# Patient Record
Sex: Male | Born: 1980 | Race: White | Hispanic: No | Marital: Married | Smoking: Never smoker
Health system: Southern US, Community
[De-identification: ages and names within clinical notes are randomized; demographics above are authoritative.]

## PROBLEM LIST (undated history)

## (undated) DIAGNOSIS — F419 Anxiety disorder, unspecified: Secondary | ICD-10-CM

## (undated) DIAGNOSIS — F1910 Other psychoactive substance abuse, uncomplicated: Secondary | ICD-10-CM

## (undated) DIAGNOSIS — F32A Depression, unspecified: Secondary | ICD-10-CM

## (undated) DIAGNOSIS — F329 Major depressive disorder, single episode, unspecified: Secondary | ICD-10-CM

## (undated) DIAGNOSIS — F1110 Opioid abuse, uncomplicated: Secondary | ICD-10-CM

## (undated) DIAGNOSIS — R569 Unspecified convulsions: Secondary | ICD-10-CM

## (undated) HISTORY — PX: VARICOCELE EXCISION: SUR582

---

## 2005-11-20 ENCOUNTER — Emergency Department (HOSPITAL_COMMUNITY): Admission: EM | Admit: 2005-11-20 | Discharge: 2005-11-20 | Payer: Self-pay | Admitting: Emergency Medicine

## 2006-04-13 ENCOUNTER — Ambulatory Visit: Payer: Self-pay | Admitting: Orthopedic Surgery

## 2006-04-15 ENCOUNTER — Ambulatory Visit (HOSPITAL_COMMUNITY): Admission: RE | Admit: 2006-04-15 | Discharge: 2006-04-15 | Payer: Self-pay | Admitting: Orthopedic Surgery

## 2006-04-30 ENCOUNTER — Ambulatory Visit: Payer: Self-pay | Admitting: Orthopedic Surgery

## 2007-11-19 ENCOUNTER — Emergency Department (HOSPITAL_BASED_OUTPATIENT_CLINIC_OR_DEPARTMENT_OTHER): Admission: EM | Admit: 2007-11-19 | Discharge: 2007-11-19 | Payer: Self-pay | Admitting: Emergency Medicine

## 2008-04-29 ENCOUNTER — Emergency Department (HOSPITAL_BASED_OUTPATIENT_CLINIC_OR_DEPARTMENT_OTHER): Admission: EM | Admit: 2008-04-29 | Discharge: 2008-04-29 | Payer: Self-pay | Admitting: Emergency Medicine

## 2008-04-29 ENCOUNTER — Ambulatory Visit: Payer: Self-pay | Admitting: Diagnostic Radiology

## 2008-05-15 ENCOUNTER — Ambulatory Visit (HOSPITAL_COMMUNITY): Admission: RE | Admit: 2008-05-15 | Discharge: 2008-05-15 | Payer: Self-pay | Admitting: Neurology

## 2008-10-04 ENCOUNTER — Ambulatory Visit: Payer: Self-pay | Admitting: Orthopedic Surgery

## 2008-10-04 DIAGNOSIS — M238X9 Other internal derangements of unspecified knee: Secondary | ICD-10-CM

## 2008-10-06 ENCOUNTER — Encounter: Payer: Self-pay | Admitting: Orthopedic Surgery

## 2009-01-10 ENCOUNTER — Telehealth: Payer: Self-pay | Admitting: Orthopedic Surgery

## 2009-01-18 ENCOUNTER — Ambulatory Visit: Payer: Self-pay | Admitting: Orthopedic Surgery

## 2009-01-18 ENCOUNTER — Telehealth: Payer: Self-pay | Admitting: Orthopedic Surgery

## 2009-04-03 ENCOUNTER — Ambulatory Visit (HOSPITAL_COMMUNITY): Admission: RE | Admit: 2009-04-03 | Discharge: 2009-04-03 | Payer: Self-pay | Admitting: Family Medicine

## 2010-05-18 ENCOUNTER — Other Ambulatory Visit: Payer: Self-pay | Admitting: Orthopedic Surgery

## 2010-05-18 ENCOUNTER — Encounter: Payer: Self-pay | Admitting: Sports Medicine

## 2010-05-18 DIAGNOSIS — M75101 Unspecified rotator cuff tear or rupture of right shoulder, not specified as traumatic: Secondary | ICD-10-CM

## 2010-06-03 ENCOUNTER — Other Ambulatory Visit: Payer: Self-pay

## 2010-06-11 ENCOUNTER — Other Ambulatory Visit: Payer: Self-pay

## 2010-08-12 LAB — COMPREHENSIVE METABOLIC PANEL
ALT: 11 U/L (ref 0–53)
BUN: 10 mg/dL (ref 6–23)
Calcium: 9.4 mg/dL (ref 8.4–10.5)
Creatinine, Ser: 1 mg/dL (ref 0.4–1.5)
GFR calc Af Amer: >60 mL/min (ref >60)
Glucose, Bld: 100 mg/dL — ABNORMAL HIGH (ref 70–99)

## 2010-08-12 LAB — DIFFERENTIAL
Basophils Absolute: 0.1 10*3/uL (ref 0.0–0.1)
Eosinophils Absolute: 0.1 10*3/uL (ref 0.0–0.7)
Monocytes Relative: 8 % (ref 3–12)
Neutrophils Relative %: 71 % (ref 43–77)

## 2010-08-12 LAB — URINALYSIS, ROUTINE W REFLEX MICROSCOPIC
Bilirubin Urine: NEGATIVE
Ketones, ur: NEGATIVE mg/dL
Leukocytes, UA: NEGATIVE
Nitrite: NEGATIVE
Protein, ur: 100 mg/dL — AB
Specific Gravity, Urine: 1.018 (ref 1.005–1.030)
Urobilinogen, UA: 0.2 mg/dL (ref 0.0–1.0)

## 2010-08-12 LAB — POCT TOXICOLOGY PANEL

## 2010-08-12 LAB — CBC
HCT: 45.7 % (ref 39.0–52.0)
MCHC: 33.7 g/dL (ref 30.0–36.0)
MCV: 93 fL (ref 78.0–100.0)

## 2010-08-12 LAB — URINE MICROSCOPIC-ADD ON

## 2011-01-24 LAB — COMPREHENSIVE METABOLIC PANEL
ALT: 24
Albumin: 4.8
CO2: 28
GFR calc Af Amer: >60
Total Bilirubin: 0.5

## 2011-01-24 LAB — CBC
MCV: 92.6
RBC: 4.47
RDW: 11.3 — ABNORMAL LOW
WBC: 9.6

## 2011-01-24 LAB — DIFFERENTIAL
Basophils Absolute: 0
Basophils Relative: 1
Lymphs Abs: 1.4
Neutrophils Relative %: 80 — ABNORMAL HIGH

## 2011-02-24 ENCOUNTER — Emergency Department (INDEPENDENT_AMBULATORY_CARE_PROVIDER_SITE_OTHER): Payer: BC Managed Care – PPO

## 2011-02-24 ENCOUNTER — Encounter: Payer: Self-pay | Admitting: Student

## 2011-02-24 ENCOUNTER — Emergency Department (HOSPITAL_BASED_OUTPATIENT_CLINIC_OR_DEPARTMENT_OTHER)
Admission: EM | Admit: 2011-02-24 | Discharge: 2011-02-24 | Disposition: A | Discharge status: Home / Self Care | Payer: BC Managed Care – PPO | Attending: Emergency Medicine | Admitting: Emergency Medicine

## 2011-02-24 DIAGNOSIS — R197 Diarrhea, unspecified: Secondary | ICD-10-CM

## 2011-02-24 DIAGNOSIS — R110 Nausea: Secondary | ICD-10-CM

## 2011-02-24 DIAGNOSIS — R109 Unspecified abdominal pain: Secondary | ICD-10-CM | POA: Insufficient documentation

## 2011-02-24 DIAGNOSIS — R51 Headache: Secondary | ICD-10-CM | POA: Insufficient documentation

## 2011-02-24 DIAGNOSIS — F341 Dysthymic disorder: Secondary | ICD-10-CM | POA: Insufficient documentation

## 2011-02-24 DIAGNOSIS — R1031 Right lower quadrant pain: Secondary | ICD-10-CM

## 2011-02-24 DIAGNOSIS — R112 Nausea with vomiting, unspecified: Secondary | ICD-10-CM | POA: Insufficient documentation

## 2011-02-24 DIAGNOSIS — R1110 Vomiting, unspecified: Secondary | ICD-10-CM

## 2011-02-24 HISTORY — DX: Anxiety disorder, unspecified: F41.9

## 2011-02-24 HISTORY — DX: Major depressive disorder, single episode, unspecified: F32.9

## 2011-02-24 HISTORY — DX: Depression, unspecified: F32.A

## 2011-02-24 LAB — BASIC METABOLIC PANEL
BUN: 22 mg/dL (ref 6–23)
CO2: 26 mEq/L (ref 19–32)
Calcium: 9.5 mg/dL (ref 8.4–10.5)
Chloride: 101 mEq/L (ref 96–112)
Creatinine, Ser: 1 mg/dL (ref 0.50–1.35)
GFR calc non Af Amer: >90 mL/min (ref >90)
Glucose, Bld: 163 mg/dL — ABNORMAL HIGH (ref 70–99)
Potassium: 3.8 mEq/L (ref 3.5–5.1)

## 2011-02-24 LAB — URINALYSIS, ROUTINE W REFLEX MICROSCOPIC
Bilirubin Urine: NEGATIVE
Leukocytes, UA: NEGATIVE
Nitrite: NEGATIVE
Protein, ur: NEGATIVE mg/dL
pH: 6.5 (ref 5.0–8.0)

## 2011-02-24 LAB — DIFFERENTIAL
Basophils Absolute: 0 10*3/uL (ref 0.0–0.1)
Basophils Relative: 0 % (ref 0–1)
Eosinophils Absolute: 0 10*3/uL (ref 0.0–0.7)
Lymphs Abs: 0.3 10*3/uL — ABNORMAL LOW (ref 0.7–4.0)
Monocytes Absolute: 0.9 10*3/uL (ref 0.1–1.0)
Neutrophils Relative %: 92 % — ABNORMAL HIGH (ref 43–77)

## 2011-02-24 LAB — CBC
Hemoglobin: 16.5 g/dL (ref 13.0–17.0)
MCHC: 35.3 g/dL (ref 30.0–36.0)
Platelets: 340 10*3/uL (ref 150–400)
RBC: 5.3 MIL/uL (ref 4.22–5.81)
RDW: 12.3 % (ref 11.5–15.5)

## 2011-02-24 MED ORDER — SODIUM CHLORIDE 0.9 % IV BOLUS (SEPSIS)
1000.0000 mL | Freq: Once | INTRAVENOUS | Status: AC
  Administered 2011-02-24: 1000 mL via INTRAVENOUS

## 2011-02-24 MED ORDER — METOCLOPRAMIDE HCL 5 MG/ML IJ SOLN
10.0000 mg | Freq: Once | INTRAMUSCULAR | Status: AC
  Administered 2011-02-24: 10 mg via INTRAVENOUS
  Filled 2011-02-24: qty 2

## 2011-02-24 MED ORDER — DIPHENHYDRAMINE HCL 25 MG PO CAPS
12.5000 mg | ORAL_CAPSULE | Freq: Once | ORAL | Status: DC
  Filled 2011-02-24: qty 1

## 2011-02-24 MED ORDER — HYDROCODONE-ACETAMINOPHEN 5-325 MG PO TABS: 1.0000 | ORAL_TABLET | ORAL | Status: AC | PRN

## 2011-02-24 MED ORDER — IBUPROFEN 800 MG PO TABS: 800.0000 mg | ORAL_TABLET | Freq: Three times a day (TID) | ORAL | Status: AC

## 2011-02-24 MED ORDER — DIPHENHYDRAMINE HCL 50 MG/ML IJ SOLN
12.5000 mg | Freq: Once | INTRAMUSCULAR | Status: AC
  Administered 2011-02-24: 12.5 mg via INTRAVENOUS
  Filled 2011-02-24: qty 1

## 2011-02-24 MED ORDER — ONDANSETRON HCL 4 MG PO TABS: 4.0000 mg | ORAL_TABLET | Freq: Four times a day (QID) | ORAL | Status: AC

## 2011-02-24 MED ORDER — IOHEXOL 300 MG/ML  SOLN
100.0000 mL | Freq: Once | INTRAMUSCULAR | Status: AC | PRN
  Administered 2011-02-24: 100 mL via INTRAVENOUS

## 2011-02-24 NOTE — ED Notes (Signed)
Pt in with c/o sudden onset of N V D MHA abd right sided abdominal pain at 0300 this morning. +N and several episodes of V prior to arrival. Reports MHA centered in middle of forehead behind both eyes, +photophobia and stick neck. No prior HX of migraines. Reports HA as worst ever and has associated floaters and spots. Room darkened. Abd pain is located in RLQ and RUQ. Abd tender in RLQ RUQ -  Denies dysuria.

## 2011-02-24 NOTE — ED Provider Notes (Signed)
History     CSN: [id number] Arrival date & time: 02/24/2011 11:50 AM   First MD Initiated Contact with Patient 02/24/11 1207      Chief Complaint  Patient presents with  . Nausea  . Emesis  . Diarrhea  . Migraine  . Abdominal Pain    (Consider location/radiation/quality/duration/timing/severity/associated sxs/prior treatment) HPI History provided by patient.  Pt woke w/ constant, throbbing pain between eyes at approx 3am today.  Waxing and waning but severe at peak.  Associated w/ photo/phonophobia, over a hundred episodes of vomiting, as well as blurred vision, dizziness, ataxia and confusion later.  Has also had diarrhea and abd pain.  Abd pain central initially and now located in RLQ.  Started after vomiting and is most prominent when vomiting but constant.  Denies anorexia.  No h/o abd surgeries.  Pt denies fever and recent head trauma. No prior h/o migraines and has never had a headache of this severity.  PCP referred to ER to further evaluation but IV hydration.   Past Medical History  Diagnosis Date  . Anxiety   . Adult attention deficit disorder   . Depression     History reviewed. No pertinent past surgical history.  History reviewed. No pertinent family history.  History  Substance Use Topics  . Smoking status: Never Smoker   . Smokeless tobacco: Not on file  . Alcohol Use: Yes      Review of Systems  All other systems reviewed and are negative.    Allergies  Pseudophed-chlophedianol-gg  Home Medications   Current Outpatient Rx  Name Route Sig Dispense Refill  . ALPRAZOLAM 1 MG PO TABS Oral Take 1 mg by mouth at bedtime as needed.      Marland Kitchen FLUOXETINE HCL 20 MG PO TABS Oral Take 20 mg by mouth daily.      Marland Kitchen LISDEXAMFETAMINE DIMESYLATE 30 MG PO CAPS Oral Take 30 mg by mouth every morning.        BP 121/74  Pulse 85  Temp(Src) 97.9 F (36.6 C) (Oral)  Resp 18  SpO2 96%  Physical Exam  Nursing note and vitals reviewed. Constitutional: He is  oriented to person, place, and time. He appears well-developed and well-nourished. No distress.  HENT:  Head: Normocephalic.       Mucous membranes dry  Eyes:       Normal appearance  Neck: Normal range of motion.       No meningeal signs  Cardiovascular: Normal rate and regular rhythm.        HR 90s  Pulmonary/Chest: Effort normal and breath sounds normal.  Abdominal: Soft. Bowel sounds are normal. He exhibits no distension and no mass. There is no rebound and no guarding.       Mod-sev ttp RLQ and mid-line lower abd  Musculoskeletal: Normal range of motion.  Neurological: He is alert and oriented to person, place, and time. No cranial nerve deficit or sensory deficit. Coordination normal.       5/5 and equal upper and lower extremity strength.  No past pointing.  No pronator drift or romberg sign.  Nml gait.   Skin: Skin is warm and dry. No rash noted.  Psychiatric: He has a normal mood and affect. His behavior is normal.    ED Course  Procedures (including critical care time)  Labs Reviewed  CBC - Abnormal; Notable for the following:    WBC 15.9 (*)    All other components within normal limits  DIFFERENTIAL - Abnormal; Notable  for the following:    Neutrophils Relative 92 (*)    Neutro Abs 14.7 (*)    Lymphocytes Relative 2 (*)    Lymphs Abs 0.3 (*)    All other components within normal limits  BASIC METABOLIC PANEL - Abnormal; Notable for the following:    Glucose, Bld 163 (*)    All other components within normal limits  URINALYSIS, ROUTINE W REFLEX MICROSCOPIC - Abnormal; Notable for the following:    Specific Gravity, Urine >1.046 (*)    All other components within normal limits   Ct Head Wo Contrast  02/24/2011  *RADIOLOGY REPORT*  Clinical Data:  Nausea with diarrhea and headache.  CT HEAD WITHOUT CONTRAST  Technique:  Contiguous axial images were obtained from the base of the skull through the vertex without contrast  Comparison:  MRI brain 05/15/2008.  Findings:   The brain has a normal appearance without evidence for hemorrhage, acute infarction, hydrocephalus, or mass lesion.  There is no extra axial fluid collection.  The skull and paranasal sinuses are normal. No change from prior MR.  IMPRESSION: Normal CT of the head without contrast.  Original Report Authenticated By: Elsie Stain, M.D.   Ct Abdomen Pelvis W Contrast  02/24/2011  *RADIOLOGY REPORT*  Clinical Data: Right lower quadrant abdominal pain with nausea, vomiting and diarrhea.  Migraine headaches.  CT ABDOMEN AND PELVIS WITH CONTRAST  Technique:  Multidetector CT imaging of the abdomen and pelvis was performed following the standard protocol during bolus administration of intravenous contrast.  Contrast: OMNIPAQUE IOHEXOL 300 MG/ML IV SOLN  Comparison: Abdominal pelvic CT 11/19/2007.  Findings: The lung bases are clear.  There is no pleural effusion. The liver, gallbladder, biliary system and pancreas appear normal. The spleen, adrenal glands and kidneys appear normal.  The colon is fluid-filled.  There is no colonic wall thickening or surrounding inflammatory change.  The small bowel and appendix appear normal.  No vascular abnormalities are identified.  There is no lymphadenopathy.  The urinary bladder, prostate gland and seminal vesicles appear normal.  There is no evidence of hernia. The osseous structures appear stable with Schmorl's node formation.  IMPRESSION:  1.  No acute pelvic findings.  No evidence of appendicitis. 2.  Fluid filled colon without evidence of bowel obstruction.  Original Report Authenticated By: Gerrianne Scale, M.D.     1. Headache   2. Vomiting       MDM  Healthy 30yo M presents w/ severe, non-traumatic headache since 3am today.  No prior history.  Has also had RLQ pain, vomiting and diarrhea.  Referred by PCP for IV hydration and further evaluation of sx. On exam, afebrile, NAD, VS w/in nml limits, no focal neuro deficits, RLQ ttp w/out gaurding/rebound.   CT head ordered but pt feels that it is unnecessary at this time and would like to wait and see how he feels after a dose of pain medication.  Reglan ordered and receiving IV fluids as well.  Will reassess shortly.  Labs and CT abd/pelvis to r/o appendicitis pending.  12:52 PM   Pt reports that headache has greatly improved.  He agreed to have CT head. 1:57 PM   CT head and abd/pelvis neg for acute pathology.  Results discussed w/ pt.  Headache has resolved.  VSS.  Tolerating pos.  Pt says that he may have been exaggerating when he reported "worst headache in his life".  S/sx meningitis as well as strict return precautions discussed.  3:24 PM  Otilio Miu, PA 02/25/11 0225

## 2011-02-28 NOTE — ED Provider Notes (Signed)
Medical screening examination/treatment/procedure(s) were performed by non-physician practitioner and as supervising physician I was immediately available for consultation/collaboration.  Adolph Clutter, MD 02/28/11 1807 

## 2011-06-09 ENCOUNTER — Ambulatory Visit (INDEPENDENT_AMBULATORY_CARE_PROVIDER_SITE_OTHER): Payer: BC Managed Care – PPO | Admitting: Emergency Medicine

## 2011-06-09 ENCOUNTER — Ambulatory Visit: Payer: BC Managed Care – PPO

## 2011-06-09 VITALS — BP 141/84 | HR 102 | Temp 99.0°F | Resp 16 | Ht 68.5 in | Wt 188.2 lb

## 2011-06-09 DIAGNOSIS — S93409A Sprain of unspecified ligament of unspecified ankle, initial encounter: Secondary | ICD-10-CM

## 2011-06-09 DIAGNOSIS — M79609 Pain in unspecified limb: Secondary | ICD-10-CM

## 2011-06-09 MED ORDER — HYDROCODONE-ACETAMINOPHEN 5-500 MG PO TABS: 1.0000 | ORAL_TABLET | Freq: Four times a day (QID) | ORAL | Status: AC | PRN

## 2011-06-09 NOTE — Progress Notes (Signed)
  Subjective:    Patient ID: Adam Davenport, male    DOB: [birth year], 31 y.o.   MRN: [id number]  HPIPatient suffered an inversion injury to his ankle    Review of Systems non contributory     Objective:   Physical Examtender over lateral malleolus   [facility] reading (PRIMARY) by  Dr. Cleta Alberts no fracture.      Assessment & Plan:  aircast pain meds

## 2011-06-09 NOTE — Patient Instructions (Signed)
Ankle Sprain °An ankle sprain is an injury to the ligaments that hold the ankle joint together.  °CAUSES °The injury is usually caused by a fall or by twisting the ankle. It is important to tell your caregiver how the injury occurred and whether or not you were able to walk immediately after the injury.  °SYMPTOMS  °Pain is the primary symptom. It may be present at rest or only when you are trying to stand or walk. The ankle will likely be swollen. Bruising may develop immediately or after 1 or 2 days. It may be difficult or impossible to stand or walk. This depends on the severity of the sprain. °DIAGNOSIS  °Your caregiver can determine if a sprain has occurred based on the accident details and on examination of your ankle. Examination will include pressing and squeezing areas of the foot and ankle. Your caregiver will try to move the ankle in certain ways. X-rays may be used to be sure a bone was not broken, or that the ligament did not pull off of a bone (avulsion). There are standard guidelines that can reliably determine if an X-ray is needed. °TREATMENT  °Rest, ice, elevation, and compression are the basic modes of treatment. Certain types of braces can help stabilize the ankle and allow early return to walking. Your caregiver can make a recommendation for this. Medication may be recommended for pain. You may be referred to an orthopedist or a physical therapist for certain types of severe sprains. °HOME CARE INSTRUCTIONS  °· Apply ice to the sore area for 15 to 20 minutes, 3 to 4 times per day. Do this while you are awake for the first 2 days, or as directed. This can be stopped when the swelling goes away. Put the ice in a plastic bag and place a towel between the bag of ice and your skin.  °· Keep your leg elevated when possible to lessen swelling.  °· If your caregiver recommends crutches, use them as instructed with a non-weight bearing cast for 1 week. Then, you may walk on your ankle as the pain allows,  or as instructed. Gradually, put weight on the affected ankle. Continue to use crutches or a cane until you can walk without causing pain.  °· If a plaster splint was applied, wear the splint until you are seen for a follow-up examination. Rest it on nothing harder than a pillow the first 24 hours. Do not put weight on it. Do not get it wet. You may take it off to take a shower or bath.  °· You may have been given an elastic bandage to use with the plaster splint, or you may have been given a elastic bandage to use alone. The elastic bandage is too tight if you have numbness, tingling, or if your foot becomes cold and blue. Adjust the bandage to make it comfortable.  °· If an air splint was applied, you may blow more air into it or take some out to make it more comfortable. You may take it off at night and to take a shower or bath. Wiggle your toes in the splint several times per day if you are able.  °· Only take over-the-counter or prescription medicines for pain, discomfort, or fever as directed by your caregiver.  °· Do not drive a vehicle until your caregiver specifically tells you it is safe to do so.  °SEEK MEDICAL CARE IF:  °· You have an increase in bruising, swelling, or pain.  °· Your   toes feel cold.  °· Pain relief is not achieved with medications.  °SEEK IMMEDIATE MEDICAL CARE IF: °Your toes are numb or blue or you have severe pain. °MAKE SURE YOU:  °· Understand these instructions.  °· Will watch your condition.  °· Will get help right away if you are not doing well or get worse.  °Document Released: 04/14/2005 Document Revised: 07/19/2010 Document Reviewed: 11/17/2007 °[employer]® Patient Information ©2012 [employer]. °

## 2011-09-01 ENCOUNTER — Ambulatory Visit (INDEPENDENT_AMBULATORY_CARE_PROVIDER_SITE_OTHER): Payer: BC Managed Care – PPO | Admitting: Internal Medicine

## 2011-09-01 ENCOUNTER — Ambulatory Visit: Payer: BC Managed Care – PPO

## 2011-09-01 VITALS — BP 128/82 | HR 112 | Temp 98.5°F | Resp 16 | Ht 69.5 in | Wt 195.0 lb

## 2011-09-01 DIAGNOSIS — S60229A Contusion of unspecified hand, initial encounter: Secondary | ICD-10-CM

## 2011-09-01 DIAGNOSIS — S6290XA Unspecified fracture of unspecified wrist and hand, initial encounter for closed fracture: Secondary | ICD-10-CM

## 2011-09-01 DIAGNOSIS — F988 Other specified behavioral and emotional disorders with onset usually occurring in childhood and adolescence: Secondary | ICD-10-CM | POA: Insufficient documentation

## 2011-09-01 DIAGNOSIS — M79643 Pain in unspecified hand: Secondary | ICD-10-CM

## 2011-09-01 DIAGNOSIS — M79609 Pain in unspecified limb: Secondary | ICD-10-CM

## 2011-09-01 MED ORDER — HYDROCODONE-ACETAMINOPHEN 5-500 MG PO TABS: ORAL_TABLET | ORAL | Status: DC
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
## 2011-11-10 ENCOUNTER — Ambulatory Visit (HOSPITAL_COMMUNITY)
Admission: RE | Admit: 2011-11-10 | Discharge: 2011-11-10 | Disposition: A | Discharge status: Home / Self Care | Payer: BC Managed Care – PPO | Source: Ambulatory Visit | Attending: Internal Medicine | Admitting: Internal Medicine

## 2011-11-10 ENCOUNTER — Other Ambulatory Visit (HOSPITAL_COMMUNITY): Payer: Self-pay | Admitting: Internal Medicine

## 2011-11-10 DIAGNOSIS — S6990XA Unspecified injury of unspecified wrist, hand and finger(s), initial encounter: Secondary | ICD-10-CM | POA: Insufficient documentation

## 2011-11-10 DIAGNOSIS — S6980XA Other specified injuries of unspecified wrist, hand and finger(s), initial encounter: Secondary | ICD-10-CM | POA: Insufficient documentation

## 2011-11-10 DIAGNOSIS — X58XXXA Exposure to other specified factors, initial encounter: Secondary | ICD-10-CM | POA: Insufficient documentation

## 2011-11-10 DIAGNOSIS — S6390XA Sprain of unspecified part of unspecified wrist and hand, initial encounter: Secondary | ICD-10-CM

## 2011-11-10 DIAGNOSIS — M79609 Pain in unspecified limb: Secondary | ICD-10-CM | POA: Insufficient documentation

## 2012-11-03 ENCOUNTER — Emergency Department (HOSPITAL_COMMUNITY)
Admission: EM | Admit: 2012-11-03 | Discharge: 2012-11-03 | Disposition: A | Discharge status: Home / Self Care | Payer: BC Managed Care – PPO | Attending: Emergency Medicine | Admitting: Emergency Medicine

## 2012-11-03 ENCOUNTER — Encounter (HOSPITAL_COMMUNITY): Payer: Self-pay | Admitting: *Deleted

## 2012-11-03 ENCOUNTER — Emergency Department (HOSPITAL_COMMUNITY): Payer: BC Managed Care – PPO

## 2012-11-03 DIAGNOSIS — R319 Hematuria, unspecified: Secondary | ICD-10-CM | POA: Insufficient documentation

## 2012-11-03 DIAGNOSIS — F3289 Other specified depressive episodes: Secondary | ICD-10-CM | POA: Insufficient documentation

## 2012-11-03 DIAGNOSIS — Z79899 Other long term (current) drug therapy: Secondary | ICD-10-CM | POA: Insufficient documentation

## 2012-11-03 DIAGNOSIS — F411 Generalized anxiety disorder: Secondary | ICD-10-CM | POA: Insufficient documentation

## 2012-11-03 DIAGNOSIS — R1032 Left lower quadrant pain: Secondary | ICD-10-CM | POA: Insufficient documentation

## 2012-11-03 DIAGNOSIS — F988 Other specified behavioral and emotional disorders with onset usually occurring in childhood and adolescence: Secondary | ICD-10-CM | POA: Insufficient documentation

## 2012-11-03 DIAGNOSIS — R112 Nausea with vomiting, unspecified: Secondary | ICD-10-CM | POA: Insufficient documentation

## 2012-11-03 DIAGNOSIS — F329 Major depressive disorder, single episode, unspecified: Secondary | ICD-10-CM | POA: Insufficient documentation

## 2012-11-03 LAB — URINALYSIS, ROUTINE W REFLEX MICROSCOPIC
Glucose, UA: NEGATIVE mg/dL
Ketones, ur: NEGATIVE mg/dL

## 2012-11-03 MED ORDER — FENTANYL CITRATE 0.05 MG/ML IJ SOLN
50.0000 ug | Freq: Once | INTRAMUSCULAR | Status: AC
  Administered 2012-11-03: 50 ug via INTRAVENOUS
  Filled 2012-11-03: qty 2

## 2012-11-03 MED ORDER — KETOROLAC TROMETHAMINE 30 MG/ML IJ SOLN
30.0000 mg | Freq: Once | INTRAMUSCULAR | Status: AC
  Administered 2012-11-03: 30 mg via INTRAVENOUS
  Filled 2012-11-03: qty 1

## 2012-11-03 MED ORDER — ONDANSETRON HCL 4 MG/2ML IJ SOLN
4.0000 mg | Freq: Once | INTRAMUSCULAR | Status: AC
  Administered 2012-11-03: 4 mg via INTRAVENOUS
  Filled 2012-11-03: qty 2

## 2012-11-03 NOTE — ED Notes (Signed)
Pt states started having lower right side pelvic pain & has not been able to urinate. Increase pain when tries to urinate, pt states he has vomited some since pain started.

## 2012-11-03 NOTE — ED Notes (Signed)
Ambulatory to bathroom to void.

## 2012-11-03 NOTE — ED Provider Notes (Signed)
History    CSN: [id number] Arrival date & time 11/03/12  0508  First MD Initiated Contact with Patient 11/03/12 0533     Chief Complaint  Patient presents with  . Flank Pain   (Consider location/radiation/quality/duration/timing/severity/associated sxs/prior Treatment) HPI  Patient reports he was awakened at 2 AM with left lower quadrant pain that radiates into his left flank. He has had nausea and vomiting and felt the urge to have diarrhea however he has not had a bowel movement. He also feels like he needs to urinate and has an intense burning in the tip of his penis. He states he is unable to urinate. He denies any fever. He states nothing he does makes the pain hurts more, nothing he does makes it feel better. He states he was unable to sit still at home. He states he's never had this before. He does report caffeine and a lot of milk ingestion. He also states his mother has had kidney stones twice.   PCP Dr Cyndia Bent  Past Medical History  Diagnosis Date  . Anxiety   . Adult attention deficit disorder   . Depression    History reviewed. No pertinent past surgical history. No family history on file. MOP renal stones   History  Substance Use Topics  . Smoking status: Never Smoker   . Smokeless tobacco: Not on file  . Alcohol Use: Yes  states he drinks a lot of caffeine and milk  Review of Systems  All other systems reviewed and are negative.    Allergies  Pseudophed-chlophedianol-gg and Pseudoephedrine  Home Medications   Current Outpatient Rx  Name  Route  Sig  Dispense  Refill  . alprazolam (XANAX) 2 MG tablet   Oral   Take 2 mg by mouth 2 (two) times daily as needed.         Marland Kitchen lisdexamfetamine (VYVANSE) 30 MG capsule   Oral   Take 30 mg by mouth every morning.           Marland Kitchen buPROPion (WELLBUTRIN XL) 150 MG 24 hr tablet   Oral   Take 150 mg by mouth daily.         Marland Kitchen FLUoxetine (PROZAC) 10 MG tablet   Oral   Take 10 mg by mouth daily.         Marland Kitchen  HYDROcodone-acetaminophen (VICODIN) 5-500 MG per tablet      1 at bedtime as needed for sleep/May repeat in 6 hours   12 tablet   0    BP 138/95  Pulse 68  Temp(Src) 97.8 F (36.6 C) (Oral)  Resp 20  Ht 5\' 8"  (1.727 m)  Wt 171 lb (77.565 kg)  BMI 26.01 kg/m2  SpO2 98%  Vital signs normal   Physical Exam  Nursing note and vitals reviewed. Constitutional: He is oriented to person, place, and time. He appears well-developed and well-nourished.  Non-toxic appearance. He does not appear ill. He appears distressed.  Appears uncomfortable  HENT:  Head: Normocephalic and atraumatic.  Right Ear: External ear normal.  Left Ear: External ear normal.  Nose: Nose normal. No mucosal edema or rhinorrhea.  Mouth/Throat: Oropharynx is clear and moist and mucous membranes are normal. No dental abscesses or edematous.  Eyes: Conjunctivae and EOM are normal. Pupils are equal, round, and reactive to light.  Neck: Normal range of motion and full passive range of motion without pain. Neck supple.  Cardiovascular: Normal rate, regular rhythm and normal heart sounds.  Exam reveals no gallop  and no friction rub.   No murmur heard. Pulmonary/Chest: Effort normal and breath sounds normal. No respiratory distress. He has no wheezes. He has no rhonchi. He has no rales. He exhibits no tenderness and no crepitus.  Abdominal: Soft. Normal appearance and bowel sounds are normal. He exhibits no distension. There is tenderness. There is no rebound and no guarding.    Pt is tender in his LLQ, left abdomen and flank  Musculoskeletal: Normal range of motion. He exhibits no edema and no tenderness.  Moves all extremities well.   Neurological: He is alert and oriented to person, place, and time. He has normal strength. No cranial nerve deficit.  Skin: Skin is warm, dry and intact. No rash noted. No erythema. There is pallor.  Psychiatric: He has a normal mood and affect. His speech is normal and behavior is normal.  His mood appears not anxious.    ED Course  Procedures (including critical care time)  Medications  ketorolac (TORADOL) 30 MG/ML injection 30 mg (not administered)  ondansetron (ZOFRAN) injection 4 mg (4 mg Intravenous Given 11/03/12 0553)  fentaNYL (SUBLIMAZE) injection 50 mcg (50 mcg Intravenous Given 11/03/12 0552)    Recheck 07:15 pain is much improved, has been able to urinate. Just returned from CT.   Have discussed CT results, persistent in telling how bad the pain was, now has muscle soreness, also c/o sharp pain when he urinates. ? Stone in urethra, CT does not include penis. Will refer to Dr Jerre Simon if that pain persists.   Results for orders placed during the hospital encounter of 11/03/12  URINALYSIS, ROUTINE W REFLEX MICROSCOPIC      Result Value Range   Color, Urine YELLOW  YELLOW   APPearance CLEAR  CLEAR   Specific Gravity, Urine >1.030 (*) 1.005 - 1.030   pH 6.0  5.0 - 8.0   Glucose, UA NEGATIVE  NEGATIVE mg/dL   Hgb urine dipstick LARGE (*) NEGATIVE   Bilirubin Urine NEGATIVE  NEGATIVE   Ketones, ur NEGATIVE  NEGATIVE mg/dL   Protein, ur TRACE (*) NEGATIVE mg/dL   Urobilinogen, UA 0.2  0.0 - 1.0 mg/dL   Nitrite NEGATIVE  NEGATIVE   Leukocytes, UA NEGATIVE  NEGATIVE  URINE MICROSCOPIC-ADD ON      Result Value Range   RBC / HPF TOO NUMEROUS TO COUNT  <3 RBC/hpf   Laboratory interpretation all normal except hematuria   Ct Abdomen Pelvis Wo Contrast  11/03/2012   *RADIOLOGY REPORT*  Clinical Data: Left lower quadrant pain.  Flank pain.  CT ABDOMEN AND PELVIS WITHOUT CONTRAST  Technique:  Multidetector CT imaging of the abdomen and pelvis was performed following the standard protocol without intravenous contrast.  Comparison: 02/24/2011.  Findings: Lung bases show no acute findings.  Heart size normal. No pericardial or pleural effusion.  Liver, gallbladder, adrenal glands, kidneys, spleen, pancreas, stomach and bowel are unremarkable.  Appendix is normal.  No  pathologically enlarged lymph nodes.  No free fluid.  No worrisome lytic or sclerotic lesions.  Old lower right rib fracture.  IMPRESSION: No findings to explain the patient's given symptoms.  Specifically, no urinary stones or obstruction.   Original Report Authenticated By: Leanna Battles, M.D.    1. LLQ pain   2. Hematuria     Plan discharge   Devoria Albe, MD, FACEP     MDM    Ward Givens, MD 11/03/12 [phone]

## 2015-06-06 ENCOUNTER — Emergency Department (HOSPITAL_COMMUNITY): Payer: BLUE CROSS/BLUE SHIELD

## 2015-06-06 ENCOUNTER — Emergency Department (HOSPITAL_COMMUNITY)
Admission: EM | Admit: 2015-06-06 | Discharge: 2015-06-06 | Discharge status: Against Medical Advice | Payer: BLUE CROSS/BLUE SHIELD | Attending: Emergency Medicine | Admitting: Emergency Medicine

## 2015-06-06 ENCOUNTER — Encounter (HOSPITAL_COMMUNITY): Payer: Self-pay

## 2015-06-06 DIAGNOSIS — F909 Attention-deficit hyperactivity disorder, unspecified type: Secondary | ICD-10-CM | POA: Insufficient documentation

## 2015-06-06 DIAGNOSIS — F329 Major depressive disorder, single episode, unspecified: Secondary | ICD-10-CM | POA: Insufficient documentation

## 2015-06-06 DIAGNOSIS — Y92410 Unspecified street and highway as the place of occurrence of the external cause: Secondary | ICD-10-CM | POA: Diagnosis not present

## 2015-06-06 DIAGNOSIS — F419 Anxiety disorder, unspecified: Secondary | ICD-10-CM | POA: Insufficient documentation

## 2015-06-06 DIAGNOSIS — Z79899 Other long term (current) drug therapy: Secondary | ICD-10-CM | POA: Insufficient documentation

## 2015-06-06 DIAGNOSIS — Y998 Other external cause status: Secondary | ICD-10-CM | POA: Diagnosis not present

## 2015-06-06 DIAGNOSIS — Z041 Encounter for examination and observation following transport accident: Secondary | ICD-10-CM | POA: Diagnosis not present

## 2015-06-06 DIAGNOSIS — R4182 Altered mental status, unspecified: Secondary | ICD-10-CM | POA: Diagnosis not present

## 2015-06-06 DIAGNOSIS — Y9389 Activity, other specified: Secondary | ICD-10-CM | POA: Diagnosis not present

## 2015-06-06 HISTORY — DX: Unspecified convulsions: R56.9
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
## 2015-06-06 NOTE — ED Notes (Signed)
Pt refused treatment.  Dr. Effie Shy at bedside at the time to discuss risks of leaving prior to evaluation and treatment.   Pt signed AMA form.

## 2015-06-06 NOTE — ED Notes (Signed)
Patient is refusing all care, tests and treatment at this time. EDP at bedside to acknowledge patients request

## 2015-06-06 NOTE — ED Notes (Signed)
Pt denies pain at present time.  Pt is very agitated. Pt repeatedly stating that he is fine.

## 2015-06-06 NOTE — ED Notes (Addendum)
Pt awake, alert and obeys commands.  Appears neurologically intact, however doesn't appear to be responding to situation appropriately, and does keep repeating questions and not remembering when answers are given.  Pt states that he isn't refusing treatment at this time, however does wish to speak with his father prior to being treated. Informed registration that when father arrives he can come visit patient.

## 2016-07-02 ENCOUNTER — Emergency Department (HOSPITAL_COMMUNITY)
Admission: EM | Admit: 2016-07-02 | Discharge: 2016-07-03 | Disposition: A | Discharge status: Home / Self Care | Payer: 59 | Attending: Emergency Medicine | Admitting: Emergency Medicine

## 2016-07-02 ENCOUNTER — Encounter (HOSPITAL_COMMUNITY): Payer: Self-pay

## 2016-07-02 ENCOUNTER — Emergency Department (HOSPITAL_COMMUNITY): Payer: 59

## 2016-07-02 DIAGNOSIS — F909 Attention-deficit hyperactivity disorder, unspecified type: Secondary | ICD-10-CM | POA: Insufficient documentation

## 2016-07-02 DIAGNOSIS — K2960 Other gastritis without bleeding: Secondary | ICD-10-CM | POA: Insufficient documentation

## 2016-07-02 DIAGNOSIS — R072 Precordial pain: Secondary | ICD-10-CM | POA: Insufficient documentation

## 2016-07-02 DIAGNOSIS — G5602 Carpal tunnel syndrome, left upper limb: Secondary | ICD-10-CM | POA: Diagnosis not present

## 2016-07-02 DIAGNOSIS — R0789 Other chest pain: Secondary | ICD-10-CM | POA: Diagnosis present

## 2016-07-02 DIAGNOSIS — T39395A Adverse effect of other nonsteroidal anti-inflammatory drugs [NSAID], initial encounter: Secondary | ICD-10-CM

## 2016-07-02 LAB — CBC WITH DIFFERENTIAL/PLATELET
Basophils Absolute: 0 10*3/uL (ref 0.0–0.1)
Basophils Relative: 0 %
Eosinophils Absolute: 0.3 10*3/uL (ref 0.0–0.7)
Eosinophils Relative: 3 %
HEMATOCRIT: 41.8 % (ref 39.0–52.0)
Hemoglobin: 14.7 g/dL (ref 13.0–17.0)
LYMPHS PCT: 24 %
Lymphs Abs: 2.2 10*3/uL (ref 0.7–4.0)
MCH: 31.3 pg (ref 26.0–34.0)
MCHC: 35.2 g/dL (ref 30.0–36.0)
MCV: 88.9 fL (ref 78.0–100.0)
Monocytes Absolute: 1.2 10*3/uL — ABNORMAL HIGH (ref 0.1–1.0)
Monocytes Relative: 13 %
NEUTROS ABS: 5.5 10*3/uL (ref 1.7–7.7)
Neutrophils Relative %: 60 %
Platelets: 269 10*3/uL (ref 150–400)
RBC: 4.7 MIL/uL (ref 4.22–5.81)
RDW: 12.1 % (ref 11.5–15.5)
WBC: 9.2 10*3/uL (ref 4.0–10.5)

## 2016-07-02 LAB — COMPREHENSIVE METABOLIC PANEL
ALBUMIN: 4.3 g/dL (ref 3.5–5.0)
ALK PHOS: 69 U/L (ref 38–126)
ALT: 19 U/L (ref 17–63)
ANION GAP: 6 (ref 5–15)
AST: 19 U/L (ref 15–41)
BILIRUBIN TOTAL: 0.3 mg/dL (ref 0.3–1.2)
BUN: 12 mg/dL (ref 6–20)
CALCIUM: 9.2 mg/dL (ref 8.9–10.3)
CO2: 30 mmol/L (ref 22–32)
CREATININE: 0.97 mg/dL (ref 0.61–1.24)
Chloride: 101 mmol/L (ref 101–111)
GFR calc Af Amer: >60 mL/min (ref >60)
GFR calc non Af Amer: >60 mL/min (ref >60)
GLUCOSE: 112 mg/dL — AB (ref 65–99)
Potassium: 3.7 mmol/L (ref 3.5–5.1)
Sodium: 137 mmol/L (ref 135–145)
TOTAL PROTEIN: 7 g/dL (ref 6.5–8.1)

## 2016-07-02 LAB — TROPONIN I: Troponin I: <0.03 ng/mL (ref <0.03)

## 2016-07-02 LAB — D-DIMER, QUANTITATIVE: D-Dimer, Quant: <0.27 ug/mL-FEU (ref 0.00–0.50)

## 2016-07-02 MED ORDER — GI COCKTAIL ~~LOC~~
30.0000 mL | Freq: Once | ORAL | Status: AC
  Administered 2016-07-02: 30 mL via ORAL
  Filled 2016-07-02: qty 30

## 2016-07-02 NOTE — ED Notes (Signed)
Dr Ranae PalmsYelverton in prior to RN, see edp assessment for further,

## 2016-07-02 NOTE — ED Triage Notes (Signed)
Pt states he has had intermittent left chest numbness x 3 weeks, states mostly when he wakes up in the morning.  Pt states tonight he had onset of left chest pain x 10 mins pta.

## 2016-07-02 NOTE — ED Provider Notes (Signed)
[facility]-EMERGENCY DEPT Provider Note   CSN: [id number] Arrival date & time: 07/02/16  2106  By signing my name below, I, Alyssa Grove, attest that this documentation has been prepared under the direction and in the presence of Loren Racer, MD. Electronically Signed: Alyssa Grove, ED Scribe. 07/02/16. 9:50 PM.   History   Chief Complaint Chief Complaint  Patient presents with  . Chest Pain   The history is provided by the patient. No language interpreter was used.   HPI Comments: Adam Davenport is a 36 y.o. male with PMHx of Anxiety, Depression and Seizures who presents to the Emergency Department complaining of gradual onset, progressively worsening, intermittent, moderate chest pressure for 3 weeks worsening tonight at 8:30 PM. Pain is more frequently experienced in the morning after waking up and is exacerbated with deep inhalation. He denies recent trauma or injury. Pt was at rest during onset of pain. He reports associated sore throat and cough. He has been taking Prednisone, Benadryl and Ibuprofen with moderate relief to symptoms. He takes approximately 8-10 Ibuprofen daily for the past few days. He had a negative flu test. 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   He also complains of a gradual onset, constant, moderate left upper extremity numbness for the past few months. Pt notes numbness radiates down the entire arm and affects the whole hand. He does extensive typing at work. Pt denies leg pain, leg swelling, shortness of breath or any other complaints at this time.  No family history of PE/DVT or CAD. Patient does not smoke. Past Medical History:  Diagnosis Date  . Adult attention deficit disorder   . Anxiety   . Depression   . Seizures [facility])     Patient Active Problem List   Diagnosis Date Noted  . ADD (attention deficit disorder) 09/01/2011  . OTHER JOINT DERANGEMENT NEC LOWER LEG 10/04/2008    History reviewed. No pertinent surgical  history.     Home Medications    Prior to Admission medications   Medication Sig Start Date End Date Taking? Authorizing Provider  gabapentin (NEURONTIN) 600 MG tablet Take 600 mg by mouth 3 (three) times daily.   Yes Historical Provider, MD  lisdexamfetamine (VYVANSE) 50 MG capsule Take 50 mg by mouth daily.   Yes Historical Provider, MD  PRESCRIPTION MEDICATION Take 5-10 mLs by mouth every 4 (four) hours as needed (for sore throat). Compounded medication:  Diphenhydramine 12.5mg /39mL; Prednisolone 15mg /86mL; Viscous Lidocaine 2% Dispensed in a 1:1:1 ratio Pt is to swish and spit.   Yes Historical Provider, MD  pantoprazole (PROTONIX) 40 MG tablet Take 1 tablet (40 mg total) by mouth daily. 07/03/16   Loren Racer, MD    Family History No family history on file.  Social History Social History  Substance Use Topics  . Smoking status: Never Smoker  . Smokeless tobacco: Never Used  . Alcohol use Yes     Allergies   Pseudoephedrine and Pseudophed-chlophedianol-gg   Review of Systems Review of Systems  Constitutional: Negative for chills and fever.  HENT: Positive for sore throat.   Respiratory: Positive for cough. Negative for shortness of breath.   Cardiovascular: Positive for chest pain. Negative for palpitations and leg swelling.  Gastrointestinal: Positive for abdominal pain and nausea. Negative for diarrhea and vomiting.  Musculoskeletal: Negative for arthralgias, myalgias, neck pain and neck stiffness.       No leg pain  Skin: Negative for rash and wound.  Neurological: Positive for  numbness. Negative for dizziness, weakness, light-headedness and headaches.  Psychiatric/Behavioral: The patient is nervous/anxious.   All other systems reviewed and are negative.    Physical Exam Updated Vital Signs BP 120/84   Pulse 72   Temp 98.2 F (36.8 C) (Oral)   Resp 16   SpO2 98%   Physical Exam  Constitutional: He is oriented to person, place, and time. He  appears well-developed and well-nourished. No distress.  HENT:  Head: Normocephalic and atraumatic.  Mouth/Throat: Oropharynx is clear and moist. No oropharyngeal exudate.  Eyes: EOM are normal. Pupils are equal, round, and reactive to light.  Neck: Normal range of motion. Neck supple. No JVD present.  No posterior cervical tenderness to palpation.  Cardiovascular: Normal rate and regular rhythm.  Exam reveals no gallop and no friction rub.   No murmur heard. Pulmonary/Chest: Effort normal and breath sounds normal. No respiratory distress. He has no wheezes. He has no rales. He exhibits no tenderness.  Abdominal: Soft. Bowel sounds are normal. There is tenderness (epigastric tenderness with palpation.). There is no rebound and no guarding.  Musculoskeletal: Normal range of motion. He exhibits no edema or tenderness.  No lower extremity swelling, asymmetry or tenderness. Positive Tinel and Phalen sign with paresthesias to the first through third digits of the left hand.  Neurological: He is alert and oriented to person, place, and time.  Moves all extremities without deficit. Sensation fully intact.  Skin: Skin is warm and dry. Capillary refill takes less than 2 seconds. No rash noted. No erythema.  Psychiatric:  Mildly anxious appearing  Nursing note and vitals reviewed.    ED Treatments / Results  DIAGNOSTIC STUDIES: Oxygen Saturation is 95% on RA, adequate by my interpretation.    COORDINATION OF CARE:  Labs (all labs ordered are listed, but only abnormal results are displayed) Labs Reviewed  CBC WITH DIFFERENTIAL/PLATELET - Abnormal; Notable for the following:       Result Value   Monocytes Absolute 1.2 (*)    All other components within normal limits  COMPREHENSIVE METABOLIC PANEL - Abnormal; Notable for the following:    Glucose, Bld 112 (*)    All other components within normal limits  TROPONIN I  D-DIMER, QUANTITATIVE (NOT AT [facility])    EKG  EKG  Interpretation  Date/Time:  Wednesday July 02 2016 21:14:49 EST Ventricular Rate:  94 PR Interval:  140 QRS Duration: 86 QT Interval:  352 QTC Calculation: 440 R Axis:   67 Text Interpretation:  Normal sinus rhythm Normal ECG Confirmed by Ranae PalmsYELVERTON  MD, Marciel Offenberger ([id number]) on 07/02/2016 9:35:10 PM Also confirmed by Ranae PalmsYELVERTON  MD, Jemya Depierro ([id number])  on 07/02/2016 11:57:38 PM       Radiology Dg Chest 2 View  Result Date: 07/02/2016 CLINICAL DATA:  Left-sided chest pain. Left arm numbness. The productive cough. EXAM: CHEST  2 VIEW COMPARISON:  One-view chest x-ray 11/20/2005 FINDINGS: The heart size and mediastinal contours are within normal limits. Both lungs are clear. The visualized skeletal structures are unremarkable. IMPRESSION: Negative two view chest x-ray Electronically Signed   By: Marin Robertshristopher  Mattern M.D.   On: 07/02/2016 21:47    Procedures Procedures (including critical care time)  Medications Ordered in ED Medications  gi cocktail (Maalox,Lidocaine,Donnatal) (30 mLs Oral Given 07/02/16 2346)     Initial Impression / Assessment and Plan / ED Course  I have reviewed the triage vital signs and the nursing notes.  Pertinent labs & imaging results that were available during my care of the  patient were reviewed by me and considered in my medical decision making (see chart for details).     I personally performed the services described in this documentation, which was scribed in my presence. The recorded information has been reviewed and is accurate.   No evidence of ischemia on EKG. Normal troponin and d-dimer. X-ray is normal. Suspect gastritis related to recent increased NSAID use. Anxiety may be contributing to patient's symptoms. Patient has 0 heart score. Low suspicion for CAD. Will start on PPI and advised to avoid NSAIDs. We'll also place in a wrist splint and give hand surgery follow-up for likely carpal tunnel syndrome. Return precautions given. Final Clinical Impressions(s) /  ED Diagnoses   Final diagnoses:  Precordial chest pain  NSAID induced gastritis  Carpal tunnel syndrome of left wrist    New Prescriptions New Prescriptions   PANTOPRAZOLE (PROTONIX) 40 MG TABLET    Take 1 tablet (40 mg total) by mouth daily.     Loren Racer, MD 07/03/16 0005

## 2016-07-02 NOTE — ED Notes (Signed)
Pt and family updated on plan of care,  

## 2016-07-02 NOTE — ED Notes (Signed)
ED Provider at bedside. 

## 2016-07-03 MED ORDER — PANTOPRAZOLE SODIUM 40 MG PO TBEC: 40.0000 mg | DELAYED_RELEASE_TABLET | Freq: Every day | ORAL | 0 refills | Status: DC

## 2016-12-18 ENCOUNTER — Emergency Department (HOSPITAL_COMMUNITY): Payer: 59

## 2016-12-18 ENCOUNTER — Encounter (HOSPITAL_COMMUNITY): Payer: Self-pay | Admitting: Emergency Medicine

## 2016-12-18 ENCOUNTER — Emergency Department (HOSPITAL_COMMUNITY)
Admission: EM | Admit: 2016-12-18 | Discharge: 2016-12-19 | Disposition: A | Discharge status: Other Acute Inpatient Hospital | Payer: 59 | Attending: Emergency Medicine | Admitting: Emergency Medicine

## 2016-12-18 DIAGNOSIS — R402432 Glasgow coma scale score 3-8, at arrival to emergency department: Secondary | ICD-10-CM

## 2016-12-18 DIAGNOSIS — R402342 Coma scale, best motor response, flexion withdrawal, at arrival to emergency department: Secondary | ICD-10-CM | POA: Insufficient documentation

## 2016-12-18 DIAGNOSIS — R509 Fever, unspecified: Secondary | ICD-10-CM | POA: Diagnosis not present

## 2016-12-18 DIAGNOSIS — E872 Acidosis, unspecified: Secondary | ICD-10-CM

## 2016-12-18 DIAGNOSIS — T50904A Poisoning by unspecified drugs, medicaments and biological substances, undetermined, initial encounter: Secondary | ICD-10-CM | POA: Diagnosis present

## 2016-12-18 DIAGNOSIS — J9602 Acute respiratory failure with hypercapnia: Secondary | ICD-10-CM | POA: Diagnosis not present

## 2016-12-18 DIAGNOSIS — Z79899 Other long term (current) drug therapy: Secondary | ICD-10-CM | POA: Diagnosis not present

## 2016-12-18 DIAGNOSIS — Z789 Other specified health status: Secondary | ICD-10-CM

## 2016-12-18 LAB — CBC WITH DIFFERENTIAL/PLATELET
BASOS ABS: 0 10*3/uL (ref 0.0–0.1)
Basophils Relative: 0 %
EOS PCT: 0 %
Eosinophils Absolute: 0 10*3/uL (ref 0.0–0.7)
HEMATOCRIT: 45.5 % (ref 39.0–52.0)
HEMOGLOBIN: 15.4 g/dL (ref 13.0–17.0)
LYMPHS ABS: 2.9 10*3/uL (ref 0.7–4.0)
LYMPHS PCT: 22 %
MCH: 30.7 pg (ref 26.0–34.0)
MCHC: 33.8 g/dL (ref 30.0–36.0)
MCV: 90.8 fL (ref 78.0–100.0)
Monocytes Absolute: 0.9 10*3/uL (ref 0.1–1.0)
Monocytes Relative: 7 %
NEUTROS ABS: 9.4 10*3/uL — AB (ref 1.7–7.7)
NEUTROS PCT: 71 %
Platelets: 383 10*3/uL (ref 150–400)
RBC: 5.01 MIL/uL (ref 4.22–5.81)
RDW: 11.9 % (ref 11.5–15.5)
WBC: 13.3 10*3/uL — AB (ref 4.0–10.5)

## 2016-12-18 LAB — RAPID URINE DRUG SCREEN, HOSP PERFORMED
AMPHETAMINES: POSITIVE — AB
BARBITURATES: NOT DETECTED
BENZODIAZEPINES: NOT DETECTED
Cocaine: POSITIVE — AB
Opiates: POSITIVE — AB
TETRAHYDROCANNABINOL: NOT DETECTED

## 2016-12-18 LAB — BLOOD GAS, ARTERIAL
ACID-BASE DEFICIT: 4.4 mmol/L — AB (ref 0.0–2.0)
Acid-base deficit: 8.2 mmol/L — ABNORMAL HIGH (ref 0.0–2.0)
BICARBONATE: 17.1 mmol/L — AB (ref 20.0–28.0)
BICARBONATE: 20.9 mmol/L (ref 20.0–28.0)
DRAWN BY: 23534
Drawn by: 23534
FIO2: 0.7
LHR: 18 {breaths}/min
O2 CONTENT: 70 L/min
O2 Content: 100 L/min
O2 SAT: 99.4 %
O2 Saturation: 99 %
PATIENT TEMPERATURE: 35.9
PCO2 ART: 35.8 mmHg (ref 32.0–48.0)
PEEP: 5 cmH2O
PH ART: 7.181 — AB (ref 7.350–7.450)
VT: 550 mL
pCO2 arterial: 52.1 mmHg — ABNORMAL HIGH (ref 32.0–48.0)
pH, Arterial: 7.366 (ref 7.350–7.450)
pO2, Arterial: 265 mmHg — ABNORMAL HIGH (ref 83.0–108.0)
pO2, Arterial: 227 mmHg — ABNORMAL HIGH (ref 83.0–108.0)

## 2016-12-18 LAB — CK: CK TOTAL: 107 U/L (ref 49–397)

## 2016-12-18 LAB — URINALYSIS, ROUTINE W REFLEX MICROSCOPIC
BACTERIA UA: NONE SEEN
Bilirubin Urine: NEGATIVE
GLUCOSE, UA: NEGATIVE mg/dL
Hgb urine dipstick: NEGATIVE
KETONES UR: NEGATIVE mg/dL
Leukocytes, UA: NEGATIVE
NITRITE: NEGATIVE
PROTEIN: 30 mg/dL — AB
Specific Gravity, Urine: 1.024 (ref 1.005–1.030)
pH: 6 (ref 5.0–8.0)

## 2016-12-18 LAB — I-STAT CG4 LACTIC ACID, ED
Lactic Acid, Venous: 1.36 mmol/L (ref 0.5–1.9)
Lactic Acid, Venous: 1.7 mmol/L (ref 0.5–1.9)

## 2016-12-18 LAB — ACETAMINOPHEN LEVEL

## 2016-12-18 LAB — COMPREHENSIVE METABOLIC PANEL
AST: 50 U/L — AB (ref 15–41)
Albumin: 4.6 g/dL (ref 3.5–5.0)
Alkaline Phosphatase: 63 U/L (ref 38–126)
Anion gap: 26 — ABNORMAL HIGH (ref 5–15)
BUN: 22 mg/dL — ABNORMAL HIGH (ref 6–20)
CHLORIDE: 101 mmol/L (ref 101–111)
CO2: 13 mmol/L — AB (ref 22–32)
CREATININE: 1.9 mg/dL — AB (ref 0.61–1.24)
Calcium: 9.2 mg/dL (ref 8.9–10.3)
GFR calc non Af Amer: 44 mL/min — ABNORMAL LOW (ref >60)
GFR, EST AFRICAN AMERICAN: 51 mL/min — AB (ref >60)
Glucose, Bld: 192 mg/dL — ABNORMAL HIGH (ref 65–99)
POTASSIUM: 3.5 mmol/L (ref 3.5–5.1)
SODIUM: 140 mmol/L (ref 135–145)
Total Bilirubin: 0.8 mg/dL (ref 0.3–1.2)
Total Protein: 7.6 g/dL (ref 6.5–8.1)

## 2016-12-18 LAB — I-STAT CHEM 8, ED
BUN: 24 mg/dL — ABNORMAL HIGH (ref 6–20)
Calcium, Ion: 1.1 mmol/L — ABNORMAL LOW (ref 1.15–1.40)
Chloride: 104 mmol/L (ref 101–111)
Creatinine, Ser: 1.7 mg/dL — ABNORMAL HIGH (ref 0.61–1.24)
GLUCOSE: 180 mg/dL — AB (ref 65–99)
HEMATOCRIT: 46 % (ref 39.0–52.0)
HEMOGLOBIN: 15.6 g/dL (ref 13.0–17.0)
POTASSIUM: 3.6 mmol/L (ref 3.5–5.1)
Sodium: 140 mmol/L (ref 135–145)
TCO2: 13 mmol/L (ref 0–100)

## 2016-12-18 LAB — I-STAT TROPONIN, ED
Troponin i, poc: 0 ng/mL (ref 0.00–0.08)
Troponin i, poc: 0.54 ng/mL (ref 0.00–0.08)

## 2016-12-18 LAB — LACTIC ACID, PLASMA: LACTIC ACID, VENOUS: 9.7 mmol/L — AB (ref 0.5–1.9)

## 2016-12-18 LAB — CBG MONITORING, ED: GLUCOSE-CAPILLARY: 176 mg/dL — AB (ref 65–99)

## 2016-12-18 LAB — SALICYLATE LEVEL: Salicylate Lvl: <7 mg/dL (ref 2.8–30.0)

## 2016-12-18 LAB — PROTIME-INR
INR: 1.17
Prothrombin Time: 14.9 seconds (ref 11.4–15.2)

## 2016-12-18 LAB — ETHANOL: Alcohol, Ethyl (B): <5 mg/dL (ref <5)

## 2016-12-18 LAB — LIPASE, BLOOD: LIPASE: 23 U/L (ref 11–51)

## 2016-12-18 MED ORDER — VANCOMYCIN HCL 500 MG IV SOLR
500.0000 mg | Freq: Once | INTRAVENOUS | Status: AC
  Administered 2016-12-18: 500 mg via INTRAVENOUS
  Filled 2016-12-18: qty 500

## 2016-12-18 MED ORDER — SODIUM CHLORIDE 0.9 % IV BOLUS (SEPSIS): 250.0000 mL | Freq: Once | INTRAVENOUS | Status: DC

## 2016-12-18 MED ORDER — SODIUM CHLORIDE 0.9 % IV SOLN: INTRAVENOUS | Status: DC

## 2016-12-18 MED ORDER — ROCURONIUM BROMIDE 50 MG/5ML IV SOLN
80.0000 mg | Freq: Once | INTRAVENOUS | Status: AC
  Administered 2016-12-18: 80 mg via INTRAVENOUS

## 2016-12-18 MED ORDER — PIPERACILLIN-TAZOBACTAM 3.375 G IVPB 30 MIN
3.3750 g | Freq: Once | INTRAVENOUS | Status: AC
  Administered 2016-12-18: 3.375 g via INTRAVENOUS
  Filled 2016-12-18: qty 50

## 2016-12-18 MED ORDER — SODIUM CHLORIDE 0.9 % IV SOLN
INTRAVENOUS | Status: DC
  Administered 2016-12-18: 15:00:00 via INTRAVENOUS
  Administered 2016-12-18: 1000 mL/h via INTRAVENOUS
  Administered 2016-12-18: 15:00:00 via INTRAVENOUS

## 2016-12-18 MED ORDER — SODIUM BICARBONATE 8.4 % IV SOLN
50.0000 meq | Freq: Once | INTRAVENOUS | Status: AC
  Administered 2016-12-18: 50 meq via INTRAVENOUS
  Filled 2016-12-18: qty 50

## 2016-12-18 MED ORDER — LORAZEPAM 2 MG/ML IJ SOLN
INTRAMUSCULAR | Status: AC
  Administered 2016-12-18: 2 mg
  Filled 2016-12-18: qty 1

## 2016-12-18 MED ORDER — LORAZEPAM 2 MG/ML IJ SOLN
INTRAMUSCULAR | Status: AC
  Filled 2016-12-18: qty 3

## 2016-12-18 MED ORDER — SODIUM CHLORIDE 0.9 % IV BOLUS (SEPSIS)
2000.0000 mL | Freq: Once | INTRAVENOUS | Status: AC
  Administered 2016-12-18: 2000 mL via INTRAVENOUS

## 2016-12-18 MED ORDER — ACETAMINOPHEN 650 MG RE SUPP
650.0000 mg | Freq: Once | RECTAL | Status: AC
  Administered 2016-12-18: 650 mg via RECTAL
  Filled 2016-12-18: qty 1

## 2016-12-18 MED ORDER — STERILE WATER FOR INJECTION IJ SOLN
INTRAMUSCULAR | Status: AC
  Filled 2016-12-18: qty 10

## 2016-12-18 MED ORDER — NOREPINEPHRINE BITARTRATE 1 MG/ML IV SOLN
0.0000 ug/min | INTRAVENOUS | Status: DC
  Filled 2016-12-18: qty 4

## 2016-12-18 MED ORDER — LORAZEPAM 2 MG/ML IJ SOLN
0.5000 mg/h | INTRAMUSCULAR | Status: DC
  Administered 2016-12-18: 0.5 mg/h via INTRAVENOUS
  Filled 2016-12-18: qty 25

## 2016-12-18 MED ORDER — ETOMIDATE 2 MG/ML IV SOLN
20.0000 mg | Freq: Once | INTRAVENOUS | Status: AC
  Administered 2016-12-18: 20 mg via INTRAVENOUS

## 2016-12-18 MED ORDER — SODIUM CHLORIDE 0.9 % IV BOLUS (SEPSIS)
1000.0000 mL | Freq: Once | INTRAVENOUS | Status: AC
  Administered 2016-12-18: 1000 mL via INTRAVENOUS

## 2016-12-18 MED ORDER — ZIPRASIDONE MESYLATE 20 MG IM SOLR
20.0000 mg | Freq: Once | INTRAMUSCULAR | Status: AC
  Administered 2016-12-18: 20 mg via INTRAMUSCULAR

## 2016-12-18 MED ORDER — VANCOMYCIN HCL IN DEXTROSE 1-5 GM/200ML-% IV SOLN
1000.0000 mg | Freq: Once | INTRAVENOUS | Status: AC
  Administered 2016-12-18: 1000 mg via INTRAVENOUS
  Filled 2016-12-18: qty 200

## 2016-12-18 NOTE — ED Notes (Signed)
Dr. Deretha Emory notified of bp 80/38 and lactic acid >17.

## 2016-12-18 NOTE — ED Notes (Signed)
Pt on NRB at this time.  Cooling blanket applied and set on 98.6

## 2016-12-18 NOTE — ED Notes (Signed)
Date and time results received: 12/18/16 1700 (use smartphrase ".now" to insert current time)  Test:abg Critical Value: 7.18  Name of Provider Notified:1700  Orders Received? Or Actions Taken?: intubated

## 2016-12-18 NOTE — ED Notes (Signed)
Pt intubated with size 7.5 ett by Dr. Deretha Emory.  Positive color change on co2 detector, secured at 25cm at the lip.  Audible breath sounds equal per edp.

## 2016-12-18 NOTE — ED Notes (Signed)
Parents at bedside

## 2016-12-18 NOTE — ED Notes (Signed)
CRITICAL VALUE ALERT  Critical Value: Lactic Acid 9.7  Date & Time Notied:  12/18/2016 @1557   Provider Notified: Dr Deretha Emory   Orders Received/Actions taken: No orders given at this time.

## 2016-12-18 NOTE — ED Notes (Signed)
Report to [facility]
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
## 2016-12-18 NOTE — ED Notes (Signed)
Family at bedside, edp at bedside

## 2016-12-18 NOTE — ED Notes (Signed)
EDP notifed fo lactic >17
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
## 2016-12-18 NOTE — ED Provider Notes (Addendum)
[facility] DEPT Provider Note   CSN: [id number] Arrival date & time: 12/18/16  1413     History   Chief Complaint Chief Complaint  Patient presents with  . Drug Overdose    HPI Adam Davenport is a 36 y.o. male.  Patient brought in by EMS and police. The very agitated and combative verbalizing but not making any sense. Felt hot to touch. Patient found at home of unresponsive not acting normal. Then became very agitated and combative in route. No medications given by EMS in route. Patient has a past history of substance abuse. But apparently has been clean for 2 years. When family arrived express that he been acting abnormal for 2 days. There was no evidence of any drug paraphernalia noted in the room by police or EMS. Patient arrived in significant distress.      Past Medical History:  Diagnosis Date  . Adult attention deficit disorder   . Anxiety   . Depression   . Seizures [facility])     Patient Active Problem List   Diagnosis Date Noted  . ADD (attention deficit disorder) 09/01/2011  . OTHER JOINT DERANGEMENT NEC LOWER LEG 10/04/2008    History reviewed. No pertinent surgical history.     Home Medications    Prior to Admission medications   Medication Sig Start Date End Date Taking? Authorizing Provider  Buprenorphine HCl-Naloxone HCl (ZUBSOLV) 5.7-1.4 MG SUBL Place 1 tablet under the tongue daily.   Yes [provider]  finasteride (PROPECIA) 1 MG tablet Take 1 mg by mouth daily.   Yes [provider]  gabapentin (NEURONTIN) 600 MG tablet Take 600 mg by mouth 3 (three) times daily.   Yes [provider]  hydrOXYzine (VISTARIL) 25 MG capsule Take 25 mg by mouth 3 (three) times daily as needed.   Yes [provider]  lisdexamfetamine (VYVANSE) 50 MG capsule Take 50 mg by mouth daily.   Yes [provider]    Family History History reviewed. No pertinent family history.  Social History Social History  Substance  Use Topics  . Smoking status: Never Smoker  . Smokeless tobacco: Never Used  . Alcohol use Yes     Allergies   Pseudoephedrine and Pseudophed-chlophedianol-gg   Review of Systems Review of Systems  Unable to perform ROS: Patient unresponsive     Physical Exam Updated Vital Signs BP 112/75   Pulse 64   Temp 98.2 F (36.8 C)   Resp 18   Ht 1.727 m (5\' 8" )   SpO2 100%   Physical Exam  Constitutional: He appears well-developed. He appears distressed.  HENT:  Head: Atraumatic.  Mucous membranes extremely dry.  Eyes:  Pupils are mid position not dilated not pinpoint not reactive.  Neck: Neck supple.  Cardiovascular: Normal rate.   Pulmonary/Chest: He is in respiratory distress.  Abdominal: Soft. Bowel sounds are normal. He exhibits no distension.  Musculoskeletal:  The patient moving all extremities spontaneously. Some questioning with posturing at times.  Neurological:  The patient awake occasionally verbalizes but nothing purposeful. Patient very combative.  Skin: Skin is warm.  Nursing note and vitals reviewed.    ED Treatments / Results  Labs (all labs ordered are listed, but only abnormal results are displayed) Labs Reviewed  COMPREHENSIVE METABOLIC PANEL - Abnormal; Notable for the following:       Result Value   CO2 13 (*)    Glucose, Bld 192 (*)    BUN 22 (*)    Creatinine, Ser  1.90 (*)    AST 50 (*)    ALT <5 (*)    GFR calc non Af Amer 44 (*)    GFR calc Af Amer 51 (*)    Anion gap 26 (*)    All other components within normal limits  CBC WITH DIFFERENTIAL/PLATELET - Abnormal; Notable for the following:    WBC 13.3 (*)    Neutro Abs 9.4 (*)    All other components within normal limits  RAPID URINE DRUG SCREEN, HOSP PERFORMED - Abnormal; Notable for the following:    Opiates POSITIVE (*)    Cocaine POSITIVE (*)    Amphetamines POSITIVE (*)    All other components within normal limits  URINALYSIS, ROUTINE W REFLEX MICROSCOPIC - Abnormal;  Notable for the following:    APPearance HAZY (*)    Protein, ur 30 (*)    Squamous Epithelial / LPF 0-5 (*)    All other components within normal limits  ACETAMINOPHEN LEVEL - Abnormal; Notable for the following:    Acetaminophen (Tylenol), Serum <10 (*)    All other components within normal limits  LACTIC ACID, PLASMA - Abnormal; Notable for the following:    Lactic Acid, Venous 9.7 (*)    All other components within normal limits  BLOOD GAS, ARTERIAL - Abnormal; Notable for the following:    pH, Arterial 7.181 (*)    pCO2 arterial 52.1 (*)    pO2, Arterial 265 (*)    Bicarbonate 17.1 (*)    Acid-base deficit 8.2 (*)    All other components within normal limits  BLOOD GAS, ARTERIAL - Abnormal; Notable for the following:    pO2, Arterial 227 (*)    Acid-base deficit 4.4 (*)    All other components within normal limits  CBG MONITORING, ED - Abnormal; Notable for the following:    Glucose-Capillary 176 (*)    All other components within normal limits  I-STAT CG4 LACTIC ACID, ED - Abnormal; Notable for the following:    Lactic Acid, Venous >17.00 (*)    All other components within normal limits  I-STAT CHEM 8, ED - Abnormal; Notable for the following:    BUN 24 (*)    Creatinine, Ser 1.70 (*)    Glucose, Bld 180 (*)    Calcium, Ion 1.10 (*)    All other components within normal limits  I-STAT TROPONIN, ED - Abnormal; Notable for the following:    Troponin i, poc 0.54 (*)    All other components within normal limits  CULTURE, BLOOD (ROUTINE X 2)  CULTURE, BLOOD (ROUTINE X 2)  URINE CULTURE  LIPASE, BLOOD  PROTIME-INR  ETHANOL  SALICYLATE LEVEL  CK  I-STAT TROPONIN, ED  I-STAT CG4 LACTIC ACID, ED  I-STAT CG4 LACTIC ACID, ED    EKG  EKG Interpretation  Date/Time:  Thursday December 18 2016 14:28:36 EDT Ventricular Rate:  159 PR Interval:    QRS Duration: 92 QT Interval:  253 QTC Calculation: 412 R Axis:   78 Text Interpretation:  Sinus tachycardia LAE, consider  biatrial enlargement RSR' in V1 or V2, probably normal variant Borderline ST depression, diffuse leads Abnormal T, consider ischemia, diffuse leads Confirmed by Vanetta Mulders [phone]) on 12/18/2016 2:56:17 PM       Radiology Ct Head Wo Contrast  Result Date: 12/18/2016 CLINICAL DATA:  Drug overdose with unresponsiveness EXAM: CT HEAD WITHOUT CONTRAST TECHNIQUE: Contiguous axial images were obtained from the base of the skull through the vertex without intravenous contrast. COMPARISON:  February 24, 2011 FINDINGS: Brain: The ventricles are normal in size and configuration. There is no intracranial mass, hemorrhage, extra-axial fluid, or midline shift. Gray-white compartments appear normal. No evident acute infarct. Vascular: No hyperdense vessel. There is no appreciable vascular calcification. Skull: Bony calvarium appears intact. Sinuses/Orbits: There is a retention cyst in the inferior left maxillary antrum. There is mucosal thickening in several ethmoid air cells bilaterally. Other paranasal sinuses are clear. No air-fluid levels noted. Orbits appear symmetric bilaterally. Other: Mastoid air cells are clear. IMPRESSION: Areas of paranasal sinus disease. No intracranial mass, hemorrhage, or extra-axial fluid collection. Gray-white compartments appear normal. Electronically Signed   By: Bretta Bang III M.D.   On: 12/18/2016 16:30   Dg Chest Port 1 View  Result Date: 12/18/2016 CLINICAL DATA:  Pt was found by mother confused and yelling out. Pt was brought in with 3 [employer] holding him down. Pt was very combative but is now unresponsive. EXAM: PORTABLE CHEST 1 VIEW COMPARISON:  07/02/2016 FINDINGS: The heart size and mediastinal contours are within normal limits. Both lungs are clear. The visualized skeletal structures are unremarkable. IMPRESSION: No active disease. Electronically Signed   By: Elige Ko   On: 12/18/2016 15:06   Dg Chest Port 1v Same Day  Result Date: 12/18/2016 CLINICAL DATA:   Post intubation EXAM: PORTABLE CHEST 1 VIEW COMPARISON:  12/18/2016 at 1449 hours. FINDINGS: The right costophrenic angle is excluded on this portable study. Heart is top-normal in size. No aortic aneurysm. Tip of an endotracheal tube is approximately 3.8 cm above the carina in satisfactory position. A gastric tube is seen coiled in the expected location of the stomach with the tip is excluded on this exam. Mild pulmonary vascular congestion is noted. No significant pleural effusion or pneumothorax. No acute osseous appearing abnormality. IMPRESSION: The tip of an endotracheal tube is in satisfactory position 3.8 cm above the carina. Gastric tube extends into the expected location of the stomach with the tip is excluded on this study. Mild pulmonary vascular congestion is noted. Electronically Signed   By: Tollie Eth M.D.   On: 12/18/2016 18:23    Procedures Procedures (including critical care time)   CRITICAL CARE Performed by: Vanetta Mulders Total critical care time: 100 minutes Critical care time was exclusive of separately billable procedures and treating other patients. Critical care was necessary to treat or prevent imminent or life-threatening deterioration. Critical care was time spent personally by me on the following activities: development of treatment plan with patient and/or surrogate as well as nursing, discussions with consultants, evaluation of patient's response to treatment, examination of patient, obtaining history from patient or surrogate, ordering and performing treatments and interventions, ordering and review of laboratory studies, ordering and review of radiographic studies, pulse oximetry and re-evaluation of patient's condition.  INTUBATION Performed by: Zyheir Daft  Required items: required blood products, implants, devices, and special equipment available Patient identity confirmed: provided demographic data and hospital-assigned identification number Time out:  Immediately prior to procedure a "time out" was called to verify the correct patient, procedure, equipment, support staff and site/side marked as required.  Indications: Markedly altered mental status not protecting her airway well. Marked metabolic acidosis with component of respiratory acidosis   Intubation method: Glidescope Laryngoscopy   Preoxygenation: 100% oxygen BVM  Sedatives: 20 mg Etomidate Paralytic: 80 mg rocuronium   Tube Size: 7.5 cuffed  Post-procedure assessment: chest rise and ETCO2 monitor Breath sounds: equal and absent over the epigastrium Tube secured with: ETT holder Chest x-ray interpreted  by radiologist and me.  Chest x-ray findings: endotracheal tube in appropriate position  Patient tolerated the procedure well with no immediate complications.     Medications Ordered in ED Medications  sterile water (preservative free) injection (not administered)  0.9 %  sodium chloride infusion ( Intravenous Transfusing/Transfer 12/18/16 2058)  norepinephrine (LEVOPHED) 4 mg in dextrose 5 % 250 mL (0.016 mg/mL) infusion (not administered)  LORazepam (ATIVAN) 50 mg in dextrose 5 % 50 mL (1 mg/mL) infusion (1.9 mg/hr Intravenous Rate/Dose Change 12/18/16 2107)  sodium chloride 0.9 % bolus 2,000 mL (0 mLs Intravenous Stopped 12/18/16 1519)  ziprasidone (GEODON) injection 20 mg (20 mg Intramuscular Given 12/18/16 1420)  sodium chloride 0.9 % bolus 1,000 mL (0 mLs Intravenous Stopped 12/18/16 1637)  acetaminophen (TYLENOL) suppository 650 mg (650 mg Rectal Given 12/18/16 1634)  piperacillin-tazobactam (ZOSYN) IVPB 3.375 g (0 g Intravenous Stopped 12/18/16 1835)  vancomycin (VANCOCIN) IVPB 1000 mg/200 mL premix (0 mg Intravenous Stopped 12/18/16 1906)  sodium bicarbonate injection 50 mEq (50 mEq Intravenous Given 12/18/16 1724)  vancomycin (VANCOCIN) 500 mg in sodium chloride 0.9 % 100 mL IVPB (0 mg Intravenous Stopped 12/18/16 2020)  etomidate (AMIDATE) injection 20 mg (20 mg  Intravenous Given 12/18/16 1735)  rocuronium (ZEMURON) injection 80 mg (80 mg Intravenous Given 12/18/16 1735)  sodium bicarbonate injection 50 mEq (50 mEq Intravenous Given 12/18/16 1830)  LORazepam (ATIVAN) 2 MG/ML injection (2 mg  Given 12/18/16 1842)  LORazepam (ATIVAN) 2 MG/ML injection (2 mg  Given 12/18/16 1859)     Initial Impression / Assessment and Plan / ED Course  I have reviewed the triage vital signs and the nursing notes.  Pertinent labs & imaging results that were available during my care of the patient were reviewed by me and considered in my medical decision making (see chart for details).    Patient with a past history of substance abuse. But has been clean for 2 years. Family noted him acting unusual for the past 2 days. Today he was completely out of it. EMS was called first responder with police patient Very combative. Not talking in normal sentences. Brought in by EMS literally out of control. 6 police officers trying to hold him down. Showing extensive strength. Very hot to touch. Appeared very dry. Pupils were mid position they were not pinpoint and they were not dilated.  Patient required Geodon to control him. Took about 40 minutes for the Geodon to kick in. Patient had to be placed in gurney 4 point restraints for his own safety. Patient initially was controlling his airway well. Geodon to kick in. Heart rate came down from 170s which was sinus tach to around 118. But as he became more relaxed his systolic blood pressure which was 135 to start with dropped all the way down to 74. Patient already received 2 L of normal saline at this point in time. Continue to provide him with 2 L normal saline boluses at a time. Blood pressure remained low until he started his seventh liter. Then started to come up. Also was given a total of 2 A of bicarbonate. For this severe metabolic acidosis. Blood pressure right prior to intubation, to 107 systolic. As he almost completed the seventh liter  of fluids he started to produce urine. First 3 hours he was here despite all the fluid boluses he only produced about 100 mL of urine. Urine flow started to flow very well after the seventh liter. Blood pressure then remained stable. With systolic pressures above  100. Heart rate also came down into a normal range.   Patient's initial labs showed that he had a pretty significant metabolic acidosis. Urine drug screen showed that he had opiates cocaine and amphetamines in his system. Patient's presentation was suggestive of a strong sympathamatic response.  Initial blood gas which was not done until after he been here for a while and had several liters of fluid showed that he had a persistent significant metabolic acidosis with a pH of 7.18. He also was retaining CO2 with his carbon dioxide levels in the low 50s. This clearly prompted the decision to intubate him. Intubation occurred without any difficulties. Blood pressures remained stable. He received etomidate and Tretha Sciara him for the intubation.  Ativan was given following the intubation when he started to get agitated and then it was switched over to an Ativan drip. There was some question whether there were short burst of seizure-like activity. Earlier x-rays to include chest x-ray and CT of head showed no significant abnormalities. Patient was covered with broad-spectrum antibiotics test due to the history of him not being normal for a couple days. Do not think this is an infectious process. He got Zosyn and vancomycin. Reasonable coverage for meningitis if present.   Patient stabilized out with the fluid challenges and the Ativan drip and being on the ventilator.  Then sought admission to ICU a [facility]. No beds available. Then sought admission to [facility] no critical care beds available. Patient's family wanted me to try [facility]. They did not prefer that he be admitted here. [facility] spoke to Dr. Patricia Pesa ICU bed was available. Patient  will be transferred there.   Follow-up labs after the fluids lactic acid went from above 17 is now down below 2. His follow-up blood gas showed a pH of 7.3. And since he was being hyperventilated. The PCO2 is in the 30s. Oxygen status wasn't good.  I still have not completely ruled out seizure activity. This was expressed to the receiving critical care specialist at [facility].  The presumption is that this is a significant drug overdose. Not clear whether it was intentional or unintentional at this point in time. No history of any suicidal thoughts according to family. Certainly no known suicide note. Does not appear to be an anticholinergic process but not completely ruled out. Pupils were never dilated. Also not completely ruled out an additional infectious process. But certainly chest x-ray and urine without evidence of that. Also no evidence on urine of any rhabdomyolysis.  Also in addition for the hyperthermia. The patient was placed on a cooling blanket. I will see the the room temp fluids also help. Temperature returned back to normal with a cooling blanket. Patient also was given suppository Tylenol once it was deemed there was no Tylenol system and liver function test appeared normal.   Final Clinical Impressions(s) / ED Diagnoses   Final diagnoses:  Drug overdose, undetermined intent, initial encounter  Metabolic acidosis  Glasgow coma scale total score 3-8, at arrival to emergency department [facility])  Hyperthermia  Acute respiratory failure with hypercapnia [facility])    New Prescriptions New Prescriptions   No medications on file     Vanetta Mulders, MD 12/18/16 2127    Vanetta Mulders, MD 01/01/17 [phone]

## 2016-12-18 NOTE — Progress Notes (Signed)
Pharmacy Antibiotic Note  Adam Davenport is a 36 y.o. male admitted on 12/18/2016 with sepsis.  Pharmacy has been consulted for vancomycin and zosyn dosing. Vanc 1gm and zosyn 3.375 gm ordered in the ED  Plan: Give additional vanc 500 mg for total load 1500 mg then 1gm IV q12 hours Cont zosyn 3.375 gm IV q8 hours F/u renal function, cultures and clinical course  Height: 5\' 8"  (172.7 cm) IBW/kg (Calculated) : 68.4  Temp (24hrs), Avg:100.6 F (38.1 C), Min:98.1 F (36.7 C), Max:104.5 F (40.3 C)   Recent Labs Lab 12/18/16 1447 12/18/16 1448 12/18/16 1449 12/18/16 1502  WBC  --  13.3*  --   --   CREATININE  --  1.90* 1.70*  --   LATICACIDVEN >17.00*  --   --  9.7*    CrCl cannot be calculated (Unknown ideal weight.).    Allergies  Allergen Reactions  . Pseudoephedrine Palpitations  . Pseudophed-Chlophedianol-Gg Palpitations    Thank you for allowing pharmacy to be a part of this patient's care.  Talbert Cage Poteet 12/18/2016 5:18 PM

## 2016-12-19 NOTE — ED Notes (Signed)
Notified Dr. Deretha Emory that patient had been medicated with 4 mg of Ativan total.  New orders given for versed up to 5 mg if needed.

## 2016-12-19 NOTE — ED Notes (Cosign Needed Addendum)
Patient posturing in the back of the ambulance, patient was given 2 mg Ativan IV bolus from bag per verbal order from Dr. Deretha Emory for transport.

## 2016-12-19 NOTE — ED Notes (Signed)
Patient care turned over to [facility], RN at [facility].  Patient was not given any Versed or Levophed.

## 2016-12-19 NOTE — ED Notes (Cosign Needed Addendum)
Patient was given another 2 mg of Ativan IV bolus from bag per Dr. Deretha Emory verbal orders for transport.

## 2016-12-20 LAB — URINE CULTURE: Culture: NO GROWTH

## 2016-12-23 LAB — CULTURE, BLOOD (ROUTINE X 2)
Culture: NO GROWTH
Culture: NO GROWTH

## 2017-07-29 ENCOUNTER — Encounter (HOSPITAL_COMMUNITY): Payer: Self-pay | Admitting: Emergency Medicine

## 2017-07-29 ENCOUNTER — Other Ambulatory Visit: Payer: Self-pay

## 2017-07-29 ENCOUNTER — Observation Stay (HOSPITAL_COMMUNITY): Payer: 59

## 2017-07-29 ENCOUNTER — Emergency Department (HOSPITAL_COMMUNITY): Payer: 59

## 2017-07-29 ENCOUNTER — Inpatient Hospital Stay (HOSPITAL_COMMUNITY)
Admission: EM | Admit: 2017-07-29 | Discharge: 2017-08-02 | DRG: 378 | Disposition: A | Discharge status: Home / Self Care | Payer: 59 | Attending: Family Medicine | Admitting: Family Medicine

## 2017-07-29 DIAGNOSIS — F1110 Opioid abuse, uncomplicated: Secondary | ICD-10-CM

## 2017-07-29 DIAGNOSIS — F1910 Other psychoactive substance abuse, uncomplicated: Secondary | ICD-10-CM | POA: Diagnosis present

## 2017-07-29 DIAGNOSIS — D62 Acute posthemorrhagic anemia: Secondary | ICD-10-CM

## 2017-07-29 DIAGNOSIS — F329 Major depressive disorder, single episode, unspecified: Secondary | ICD-10-CM | POA: Diagnosis present

## 2017-07-29 DIAGNOSIS — F558 Abuse of other non-psychoactive substances: Secondary | ICD-10-CM | POA: Diagnosis present

## 2017-07-29 DIAGNOSIS — K922 Gastrointestinal hemorrhage, unspecified: Secondary | ICD-10-CM | POA: Diagnosis present

## 2017-07-29 DIAGNOSIS — K921 Melena: Principal | ICD-10-CM

## 2017-07-29 DIAGNOSIS — K279 Peptic ulcer, site unspecified, unspecified as acute or chronic, without hemorrhage or perforation: Secondary | ICD-10-CM

## 2017-07-29 DIAGNOSIS — E876 Hypokalemia: Secondary | ICD-10-CM | POA: Diagnosis present

## 2017-07-29 DIAGNOSIS — R55 Syncope and collapse: Secondary | ICD-10-CM | POA: Diagnosis not present

## 2017-07-29 DIAGNOSIS — Z888 Allergy status to other drugs, medicaments and biological substances status: Secondary | ICD-10-CM

## 2017-07-29 DIAGNOSIS — F1410 Cocaine abuse, uncomplicated: Secondary | ICD-10-CM

## 2017-07-29 DIAGNOSIS — K2970 Gastritis, unspecified, without bleeding: Secondary | ICD-10-CM | POA: Diagnosis present

## 2017-07-29 DIAGNOSIS — K2210 Ulcer of esophagus without bleeding: Secondary | ICD-10-CM | POA: Diagnosis present

## 2017-07-29 DIAGNOSIS — R079 Chest pain, unspecified: Secondary | ICD-10-CM

## 2017-07-29 DIAGNOSIS — Z79899 Other long term (current) drug therapy: Secondary | ICD-10-CM

## 2017-07-29 DIAGNOSIS — F1020 Alcohol dependence, uncomplicated: Secondary | ICD-10-CM

## 2017-07-29 DIAGNOSIS — T39391A Poisoning by other nonsteroidal anti-inflammatory drugs [NSAID], accidental (unintentional), initial encounter: Secondary | ICD-10-CM

## 2017-07-29 DIAGNOSIS — F988 Other specified behavioral and emotional disorders with onset usually occurring in childhood and adolescence: Secondary | ICD-10-CM | POA: Diagnosis present

## 2017-07-29 DIAGNOSIS — K2980 Duodenitis without bleeding: Secondary | ICD-10-CM | POA: Diagnosis present

## 2017-07-29 DIAGNOSIS — K259 Gastric ulcer, unspecified as acute or chronic, without hemorrhage or perforation: Secondary | ICD-10-CM | POA: Diagnosis present

## 2017-07-29 DIAGNOSIS — K269 Duodenal ulcer, unspecified as acute or chronic, without hemorrhage or perforation: Secondary | ICD-10-CM | POA: Diagnosis present

## 2017-07-29 DIAGNOSIS — F419 Anxiety disorder, unspecified: Secondary | ICD-10-CM | POA: Diagnosis present

## 2017-07-29 DIAGNOSIS — R569 Unspecified convulsions: Secondary | ICD-10-CM | POA: Diagnosis present

## 2017-07-29 HISTORY — DX: Other psychoactive substance abuse, uncomplicated: F19.10

## 2017-07-29 HISTORY — DX: Opioid abuse, uncomplicated: F11.10

## 2017-07-29 LAB — CBC WITH DIFFERENTIAL/PLATELET
Basophils Absolute: 0 10*3/uL (ref 0.0–0.1)
Basophils Relative: 0 %
Eosinophils Absolute: 0 10*3/uL (ref 0.0–0.7)
Eosinophils Relative: 0 %
HEMATOCRIT: 40.8 % (ref 39.0–52.0)
HEMOGLOBIN: 13.9 g/dL (ref 13.0–17.0)
LYMPHS ABS: 1.9 10*3/uL (ref 0.7–4.0)
LYMPHS PCT: 9 %
MCH: 30.2 pg (ref 26.0–34.0)
MCHC: 34.1 g/dL (ref 30.0–36.0)
MCV: 88.7 fL (ref 78.0–100.0)
MONOS PCT: 8 %
Monocytes Absolute: 1.8 10*3/uL — ABNORMAL HIGH (ref 0.1–1.0)
NEUTROS PCT: 83 %
Neutro Abs: 18.1 10*3/uL — ABNORMAL HIGH (ref 1.7–7.7)
Platelets: 377 10*3/uL (ref 150–400)
RBC: 4.6 MIL/uL (ref 4.22–5.81)
RDW: 12.1 % (ref 11.5–15.5)
WBC: 21.8 10*3/uL — AB (ref 4.0–10.5)

## 2017-07-29 LAB — COMPREHENSIVE METABOLIC PANEL
ALBUMIN: 4.1 g/dL (ref 3.5–5.0)
ALT: 46 U/L (ref 17–63)
ANION GAP: 12 (ref 5–15)
AST: 36 U/L (ref 15–41)
Alkaline Phosphatase: 60 U/L (ref 38–126)
BUN: 35 mg/dL — ABNORMAL HIGH (ref 6–20)
CHLORIDE: 106 mmol/L (ref 101–111)
CO2: 22 mmol/L (ref 22–32)
Calcium: 8.7 mg/dL — ABNORMAL LOW (ref 8.9–10.3)
Creatinine, Ser: 1.16 mg/dL (ref 0.61–1.24)
GFR calc non Af Amer: >60 mL/min (ref >60)
GLUCOSE: 147 mg/dL — AB (ref 65–99)
Potassium: 3.1 mmol/L — ABNORMAL LOW (ref 3.5–5.1)
SODIUM: 140 mmol/L (ref 135–145)
Total Bilirubin: 0.7 mg/dL (ref 0.3–1.2)
Total Protein: 6.7 g/dL (ref 6.5–8.1)

## 2017-07-29 LAB — URINALYSIS, ROUTINE W REFLEX MICROSCOPIC
Bilirubin Urine: NEGATIVE
GLUCOSE, UA: NEGATIVE mg/dL
HGB URINE DIPSTICK: NEGATIVE
Ketones, ur: NEGATIVE mg/dL
Leukocytes, UA: NEGATIVE
Nitrite: NEGATIVE
PH: 6 (ref 5.0–8.0)
Protein, ur: NEGATIVE mg/dL
SPECIFIC GRAVITY, URINE: 1.027 (ref 1.005–1.030)

## 2017-07-29 LAB — PROTIME-INR
INR: 1.17
Prothrombin Time: 14.9 seconds (ref 11.4–15.2)

## 2017-07-29 LAB — I-STAT CG4 LACTIC ACID, ED: Lactic Acid, Venous: 2.91 mmol/L (ref 0.5–1.9)

## 2017-07-29 LAB — SEDIMENTATION RATE: Sed Rate: 4 mm/hr (ref 0–16)

## 2017-07-29 LAB — SALICYLATE LEVEL: Salicylate Lvl: <7 mg/dL (ref 2.8–30.0)

## 2017-07-29 LAB — APTT: APTT: 21 s — AB (ref 24–36)

## 2017-07-29 LAB — ACETAMINOPHEN LEVEL: Acetaminophen (Tylenol), Serum: <10 ug/mL — ABNORMAL LOW (ref 10–30)

## 2017-07-29 LAB — POC OCCULT BLOOD, ED: FECAL OCCULT BLD: POSITIVE — AB

## 2017-07-29 LAB — LIPASE, BLOOD: Lipase: 32 U/L (ref 11–51)

## 2017-07-29 MED ORDER — LISDEXAMFETAMINE DIMESYLATE 50 MG PO CAPS
50.0000 mg | ORAL_CAPSULE | Freq: Every day | ORAL | Status: DC
  Administered 2017-07-31 – 2017-08-02 (×2): 50 mg via ORAL
  Filled 2017-07-29 (×5): qty 1

## 2017-07-29 MED ORDER — PANTOPRAZOLE SODIUM 40 MG IV SOLR: 40.0000 mg | Freq: Two times a day (BID) | INTRAVENOUS | Status: DC

## 2017-07-29 MED ORDER — SODIUM CHLORIDE 0.9 % IV BOLUS: 1000.0000 mL | Freq: Once | INTRAVENOUS | Status: DC

## 2017-07-29 MED ORDER — LORAZEPAM 1 MG PO TABS: 1.0000 mg | ORAL_TABLET | Freq: Four times a day (QID) | ORAL | Status: AC | PRN

## 2017-07-29 MED ORDER — ONDANSETRON HCL 4 MG PO TABS
4.0000 mg | ORAL_TABLET | Freq: Four times a day (QID) | ORAL | Status: DC | PRN
  Administered 2017-08-01: 4 mg via ORAL
  Filled 2017-07-29: qty 1

## 2017-07-29 MED ORDER — SODIUM CHLORIDE 0.9 % IV SOLN
8.0000 mg/h | INTRAVENOUS | Status: DC
  Filled 2017-07-29 (×2): qty 80

## 2017-07-29 MED ORDER — GABAPENTIN 300 MG PO CAPS
600.0000 mg | ORAL_CAPSULE | Freq: Three times a day (TID) | ORAL | Status: DC
  Administered 2017-07-29 – 2017-08-02 (×5): 600 mg via ORAL
  Filled 2017-07-29 (×7): qty 2

## 2017-07-29 MED ORDER — THIAMINE HCL 100 MG/ML IJ SOLN
100.0000 mg | Freq: Every day | INTRAMUSCULAR | Status: DC
  Administered 2017-07-31: 100 mg via INTRAVENOUS
  Filled 2017-07-29: qty 2

## 2017-07-29 MED ORDER — SODIUM CHLORIDE 0.9 % IV SOLN
80.0000 mg | Freq: Once | INTRAVENOUS | Status: DC
  Administered 2017-07-29: 80 mg via INTRAVENOUS
  Filled 2017-07-29: qty 80

## 2017-07-29 MED ORDER — ADULT MULTIVITAMIN W/MINERALS CH
1.0000 | ORAL_TABLET | Freq: Every day | ORAL | Status: DC
  Administered 2017-07-30 – 2017-08-02 (×2): 1 via ORAL
  Filled 2017-07-29 (×4): qty 1

## 2017-07-29 MED ORDER — ONDANSETRON HCL 4 MG/2ML IJ SOLN
4.0000 mg | Freq: Four times a day (QID) | INTRAMUSCULAR | Status: DC | PRN
  Administered 2017-07-29 – 2017-07-31 (×3): 4 mg via INTRAVENOUS
  Filled 2017-07-29 (×3): qty 2

## 2017-07-29 MED ORDER — ACETAMINOPHEN 650 MG RE SUPP: 650.0000 mg | Freq: Four times a day (QID) | RECTAL | Status: DC | PRN

## 2017-07-29 MED ORDER — VITAMIN B-1 100 MG PO TABS
100.0000 mg | ORAL_TABLET | Freq: Every day | ORAL | Status: DC
  Administered 2017-07-30 – 2017-08-02 (×2): 100 mg via ORAL
  Filled 2017-07-29 (×4): qty 1

## 2017-07-29 MED ORDER — ACETAMINOPHEN 325 MG PO TABS: 650.0000 mg | ORAL_TABLET | Freq: Four times a day (QID) | ORAL | Status: DC | PRN

## 2017-07-29 MED ORDER — LACTATED RINGERS IV SOLN
INTRAVENOUS | Status: DC
  Administered 2017-07-29: 19:00:00 via INTRAVENOUS

## 2017-07-29 MED ORDER — LORAZEPAM 2 MG/ML IJ SOLN
1.0000 mg | Freq: Four times a day (QID) | INTRAMUSCULAR | Status: AC | PRN
  Administered 2017-07-30 – 2017-08-01 (×8): 1 mg via INTRAVENOUS
  Filled 2017-07-29 (×8): qty 1

## 2017-07-29 MED ORDER — LIDOCAINE 5 % EX PTCH
1.0000 | MEDICATED_PATCH | CUTANEOUS | Status: DC
  Administered 2017-07-29 – 2017-08-01 (×4): 1 via TRANSDERMAL
  Filled 2017-07-29 (×6): qty 1

## 2017-07-29 MED ORDER — PANTOPRAZOLE SODIUM 40 MG IV SOLR
INTRAVENOUS | Status: AC
  Filled 2017-07-29: qty 160

## 2017-07-29 MED ORDER — ACETAMINOPHEN 325 MG PO TABS
650.0000 mg | ORAL_TABLET | Freq: Once | ORAL | Status: AC
  Administered 2017-07-29: 650 mg via ORAL
  Filled 2017-07-29: qty 2

## 2017-07-29 MED ORDER — FAMOTIDINE IN NACL 20-0.9 MG/50ML-% IV SOLN
20.0000 mg | Freq: Once | INTRAVENOUS | Status: AC
  Administered 2017-07-29: 20 mg via INTRAVENOUS
  Filled 2017-07-29: qty 50

## 2017-07-29 MED ORDER — DIPHENHYDRAMINE HCL 25 MG PO CAPS
25.0000 mg | ORAL_CAPSULE | Freq: Every evening | ORAL | Status: DC | PRN
  Administered 2017-07-29: 25 mg via ORAL
  Filled 2017-07-29: qty 1

## 2017-07-29 MED ORDER — SODIUM CHLORIDE 0.9 % IV BOLUS
1000.0000 mL | Freq: Once | INTRAVENOUS | Status: AC
  Administered 2017-07-29: 1000 mL via INTRAVENOUS

## 2017-07-29 MED ORDER — POTASSIUM CHLORIDE IN NACL 40-0.9 MEQ/L-% IV SOLN
INTRAVENOUS | Status: AC
  Administered 2017-07-29 – 2017-07-30 (×2): 75 mL/h via INTRAVENOUS

## 2017-07-29 MED ORDER — SODIUM CHLORIDE 0.9 % IV SOLN
80.0000 mg | Freq: Once | INTRAVENOUS | Status: DC
  Filled 2017-07-29: qty 80

## 2017-07-29 MED ORDER — SODIUM CHLORIDE 0.9 % IV SOLN
8.0000 mg/h | INTRAVENOUS | Status: DC
  Administered 2017-07-30: 8 mg/h via INTRAVENOUS
  Filled 2017-07-29 (×5): qty 80

## 2017-07-29 MED ORDER — FOLIC ACID 1 MG PO TABS
1.0000 mg | ORAL_TABLET | Freq: Every day | ORAL | Status: DC
Start: 2017-07-29 — End: 2017-08-02
  Administered 2017-07-30 – 2017-08-02 (×2): 1 mg via ORAL
  Filled 2017-07-29 (×4): qty 1

## 2017-07-29 NOTE — ED Notes (Signed)
Pt unable to complete Ortho VS. Pt stated he "feels like im going to pass out" when standing. Pt lost balance, no loc. Helped back into bed. Pt states he feels better when laying down.

## 2017-07-29 NOTE — ED Notes (Signed)
Pt wanting benadryl to help him sleep. edp aware. Pt given coke per dr Rhunette Croftnanavati

## 2017-07-29 NOTE — ED Triage Notes (Signed)
Pt took a hot bath.  States he was using the bathroom and had a bowel movement with "lots" of blood. Stood up, loss consciousness, fell and hit his face on the tub.  Bleeding controlled to nose. Pt has been vomiting since. Pupils equal and reactive

## 2017-07-29 NOTE — ED Notes (Signed)
EDP reported for bladder scan to be completed once pt urinates.  Lab contacted regarding sedimentation rate order. Lab reported could be added on to initial lab draw.

## 2017-07-29 NOTE — ED Provider Notes (Signed)
[facility] MEDICAL SURGICAL UNIT Provider Note   CSN: [id number] Arrival date & time: 07/29/17  1533     History   Chief Complaint Chief Complaint  Patient presents with  . Head Injury    HPI Adam Davenport is a 37 y.o. male.  HPI 37 year old male with history of narcotic addiction, depression and seizures comes in with chief complaint of fall and bloody stools. According to patient he has been having back pain that is new since Monday.  There is no preceding trauma.  Patient has been taking 405 200 mg ibuprofen at a time over the past 2 days, and is absolutely sure that he has taken about 50 tablets of 200 mg ibuprofen in the last 48 hours.  Today patient started nausea and emesis with loose bowel movements.  Patient is also noted bloody stools x 3 times.  Patient also thinks that he has had some bloody streaks in his emesis.  Patient is not having any severe epigastric abdominal pain.  No known history of gastritis or GI bleed history.  Prior to ED arrival, patient was in the bathroom and he got dizzy and fell down.  Patient thinks that he had lost consciousness prior to falling down.  Patient is having nasal pain and headache.  Patient denies any IV drug use.  Patient states that the back pain was not preceded by any trauma. Pt has no associated numbness, weakness, urinary incontinence, urinary retention, bowel incontinence.   Past Medical History:  Diagnosis Date  . Adult attention deficit disorder   . Anxiety   . Depression   . Seizures [facility])     Patient Active Problem List   Diagnosis Date Noted  . Upper GI bleed 07/29/2017  . Hematochezia 07/29/2017  . Syncope, vasovagal 07/29/2017  . Cocaine abuse (HCC) 07/29/2017  . Opioid abuse (HCC) 07/29/2017  . Alcohol dependence (HCC) 07/29/2017  . Melena   . NSAID overdose   . ADD (attention deficit disorder) 09/01/2011  . OTHER JOINT DERANGEMENT NEC LOWER LEG 10/04/2008    History reviewed. No pertinent surgical  history.      Home Medications    Prior to Admission medications   Medication Sig Start Date End Date Taking? Authorizing Provider  gabapentin (NEURONTIN) 600 MG tablet Take 600 mg by mouth 3 (three) times daily.   Yes [provider]  lisdexamfetamine (VYVANSE) 50 MG capsule Take 50 mg by mouth daily.   Yes [provider]    Family History History reviewed. No pertinent family history.  Social History Social History   Tobacco Use  . Smoking status: Never Smoker  . Smokeless tobacco: Never Used  Substance Use Topics  . Alcohol use: Yes  . Drug use: Yes    Types: Cocaine    Comment: pain pills      Allergies   Pseudoephedrine and Pseudophed-chlophedianol-gg   Review of Systems Review of Systems  Constitutional: Positive for activity change.  Genitourinary: Negative for dysuria.  Musculoskeletal: Positive for back pain.  Neurological: Positive for syncope and headaches.  All other systems reviewed and are negative.    Physical Exam Updated Vital Signs BP 110/89 (BP Location: Right Arm)   Pulse 96   Temp 99.3 F (37.4 C) (Oral)   Resp 20   Ht 5\' 8"  (1.727 m)   Wt 77.1 kg (170 lb)   SpO2 100%   BMI 25.85 kg/m   Physical Exam  Constitutional: He is oriented to person, place, and time. He  appears well-developed.  HENT:  Head: Atraumatic.  Eyes: Pupils are equal, round, and reactive to light. EOM are normal.  Neck: Neck supple.  Cardiovascular: Normal rate.  Pulmonary/Chest: Effort normal.  Abdominal: Soft. There is no tenderness.  Digital rectal exam revealed guaiac positive stools  Neurological: He is alert and oriented to person, place, and time.  Pt has tenderness over the lower lumbar region (L3-L5) No step offs, no erythema. Pt has 1+ patellar reflex bilaterally. Able to discriminate between sharp and dull. Able to ambulate Patient has intact rectal tone   Skin: Skin is warm.  Nursing note and vitals reviewed.   STOOL  SAMPLE: MELENOTIC   ED Treatments / Results  Labs (all labs ordered are listed, but only abnormal results are displayed) Labs Reviewed  COMPREHENSIVE METABOLIC PANEL - Abnormal; Notable for the following components:      Result Value   Potassium 3.1 (*)    Glucose, Bld 147 (*)    BUN 35 (*)    Calcium 8.7 (*)    All other components within normal limits  CBC WITH DIFFERENTIAL/PLATELET - Abnormal; Notable for the following components:   WBC 21.8 (*)    Neutro Abs 18.1 (*)    Monocytes Absolute 1.8 (*)    All other components within normal limits  BLOOD GAS, VENOUS - Abnormal; Notable for the following components:   pCO2, Ven 37.4 (*)    Acid-base deficit 2.5 (*)    All other components within normal limits  ACETAMINOPHEN LEVEL - Abnormal; Notable for the following components:   Acetaminophen (Tylenol), Serum <10 (*)    All other components within normal limits  APTT - Abnormal; Notable for the following components:   aPTT 21 (*)    All other components within normal limits  POC OCCULT BLOOD, ED - Abnormal; Notable for the following components:   Fecal Occult Bld POSITIVE (*)    All other components within normal limits  I-STAT CG4 LACTIC ACID, ED - Abnormal; Notable for the following components:   Lactic Acid, Venous 2.91 (*)    All other components within normal limits  URINE CULTURE  LIPASE, BLOOD  URINALYSIS, ROUTINE W REFLEX MICROSCOPIC  SEDIMENTATION RATE  SALICYLATE LEVEL  PROTIME-INR  HIV ANTIBODY (ROUTINE TESTING)  HEMOGLOBIN A1C  BASIC METABOLIC PANEL  URINALYSIS, COMPLETE (UACMP) WITH MICROSCOPIC  CBC  CBC  HEPATITIS C ANTIBODY  HEPATITIS B SURFACE ANTIGEN  MAGNESIUM  RAPID URINE DRUG SCREEN, HOSP PERFORMED  LACTIC ACID, PLASMA  TYPE AND SCREEN    EKG EKG Interpretation  Date/Time:  Wednesday July 29 2017 15:58:36 EDT Ventricular Rate:  117 PR Interval:    QRS Duration: 88 QT Interval:  305 QTC Calculation: 426 R Axis:   83 Text  Interpretation:  Sinus tachycardia Borderline T wave abnormalities Nonspecific ST changes similar to prior.  No STEMI.  Confirmed by Alona BeneLong, Joshua [phone]([id number]) on 07/29/2017 4:02:21 PM   Radiology Dg Chest 2 View  Result Date: 07/29/2017 CLINICAL DATA:  Nausea and vomiting EXAM: CHEST - 2 VIEW COMPARISON:  12/18/2016 FINDINGS: The heart size and mediastinal contours are within normal limits. Both lungs are clear. The visualized skeletal structures are unremarkable. IMPRESSION: No active cardiopulmonary disease. Electronically Signed   By: Deatra RobinsonKevin  Herman M.D.   On: 07/29/2017 22:47   Ct Head Wo Contrast  Result Date: 07/29/2017 CLINICAL DATA:  Loss of consciousness following bowel movement with fall and maxillofacial trauma. Emesis. Initial encounter. EXAM: CT HEAD WITHOUT CONTRAST TECHNIQUE: Contiguous axial images  were obtained from the base of the skull through the vertex without intravenous contrast. COMPARISON:  12/18/2016 FINDINGS: Brain: There is no evidence of acute infarct, intracranial hemorrhage, mass, midline shift, or extra-axial fluid collection. The ventricles and sulci are normal. Vascular: No hyperdense vessel. Skull: No fracture or suspicious osseous lesion. Sinuses/Orbits: Visualized paranasal sinuses and mastoid air cells are clear. Orbits are unremarkable. Other: None. IMPRESSION: Negative head CT. Electronically Signed   By: Sebastian Ache M.D.   On: 07/29/2017 17:48    Procedures Procedures (including critical care time)  CRITICAL CARE Performed by: Vern Prestia   Total critical care time: 38 minutes  Critical care time was exclusive of separately billable procedures and treating other patients.  Critical care was necessary to treat or prevent imminent or life-threatening deterioration.  Critical care was time spent personally by me on the following activities: development of treatment plan with patient and/or surrogate as well as nursing, discussions with consultants, evaluation  of patient's response to treatment, examination of patient, obtaining history from patient or surrogate, ordering and performing treatments and interventions, ordering and review of laboratory studies, ordering and review of radiographic studies, pulse oximetry and re-evaluation of patient's condition.   Medications Ordered in ED Medications  lactated ringers infusion ( Intravenous New Bag/Given 07/29/17 1848)  lidocaine (LIDODERM) 5 % 1 patch (1 patch Transdermal Patch Applied 07/29/17 1817)  lisdexamfetamine (VYVANSE) capsule 50 mg (has no administration in time range)  gabapentin (NEURONTIN) capsule 600 mg (600 mg Oral Given 07/29/17 2243)  acetaminophen (TYLENOL) tablet 650 mg (has no administration in time range)    Or  acetaminophen (TYLENOL) suppository 650 mg (has no administration in time range)  ondansetron (ZOFRAN) tablet 4 mg ( Oral See Alternative 07/29/17 1958)    Or  ondansetron (ZOFRAN) injection 4 mg (4 mg Intravenous Given 07/29/17 1958)  diphenhydrAMINE (BENADRYL) capsule 25 mg (25 mg Oral Given 07/29/17 2244)  pantoprazole (PROTONIX) 80 mg in sodium chloride 0.9 % 100 mL IVPB (has no administration in time range)  pantoprazole (PROTONIX) 80 mg in sodium chloride 0.9 % 250 mL (0.32 mg/mL) infusion (8 mg/hr Intravenous New Bag/Given 07/30/17 0027)  pantoprazole (PROTONIX) injection 40 mg (has no administration in time range)  0.9 % NaCl with KCl 40 mEq / L  infusion (75 mL/hr Intravenous New Bag/Given 07/29/17 2243)  LORazepam (ATIVAN) tablet 1 mg (has no administration in time range)    Or  LORazepam (ATIVAN) injection 1 mg (has no administration in time range)  thiamine (VITAMIN B-1) tablet 100 mg (has no administration in time range)    Or  thiamine (B-1) injection 100 mg (has no administration in time range)  folic acid (FOLVITE) tablet 1 mg (has no administration in time range)  multivitamin with minerals tablet 1 tablet (has no administration in time range)  sodium chloride 0.9 %  bolus 1,000 mL (has no administration in time range)  sodium chloride 0.9 % bolus 1,000 mL (0 mLs Intravenous Stopped 07/29/17 1819)  famotidine (PEPCID) IVPB 20 mg premix (0 mg Intravenous Stopped 07/29/17 1736)  acetaminophen (TYLENOL) tablet 650 mg (650 mg Oral Given 07/29/17 1743)     Initial Impression / Assessment and Plan / ED Course  I have reviewed the triage vital signs and the nursing notes.  Pertinent labs & imaging results that were available during my care of the patient were reviewed by me and considered in my medical decision making (see chart for details).     37 year old comes in with  chief complaint of nausea, vomiting, diarrhea, bloody stools, syncope, headache, nose pain. Patient's headache and nasal pain are from trauma.  Patient does not have any evidence of severe maxillofacial trauma, extraocular muscles are intact and there is no indication for CT max face right now.  Patient complains of dizziness.  He admits to heavy NSAID use over the past 48 hours.  I suspect that the nausea, vomiting, bloody stools and the dizziness also could be related to NSAID overdose.  Patient's hemoglobin is dropped 2 g, his BUN is in the 30s.  We will start hydrating him.  In the ER patient had melanotic stools -which does make [place] get concerned about possible upper GI bleed/gastritis.  I spoke with Dr. Kerry Kass, GI -he recommends Protonix drip and they will see him tomorrow.  Clear liquid diet for now and n.p.o. after midnight.  These recommendations were discussed with the admitting team.  Additionally, patient is also complaining of back pain that is new.  No trauma.  Patient denies any IV drug abuse.  We did a complete neurologic exam and ordered a sed rate -and the neuro exam was completely normal and so was a sed rate.  Clinical concerns for epidural abscess is extremely low.  Postvoid residual was also less than 40 mL.  Final Clinical Impressions(s) / ED Diagnoses   Final diagnoses:    Overdose of nonsteroidal anti-inflammatory drug (NSAID), accidental or unintentional, initial encounter  Acute GI bleeding  Melena  Syncope and collapse    ED Discharge Orders    None       Derwood Kaplan, MD 07/30/17 [phone]

## 2017-07-29 NOTE — ED Notes (Signed)
Date and time results received: 07/29/17 1730   Test: Venous Blood Gas  Critical Value: P02 below reportable range  Name of Provider Notified: Dr. Rhunette CroftNanavati  Orders Received? Or Actions Taken?: No new orders given.

## 2017-07-29 NOTE — H&P (Addendum)
History and Physical  Adam Davenport VWU:[id number] DOB: [birth year] DOA: 07/29/2017   PCP: System, Pcp Not In   Patient coming from: Home  Chief Complaint: vomiting, hematochezia  HPI:  Adam Davenport is a 37 y.o. male with medical history of opioid addiction, depression/anxiety, and polysubstance abuse (cocaine, opioids, Etoh) presenting with 1 day history of nausea, vomiting, hematochezia, and syncope.  The patient had been on a buprenorphine and naloxone equivalent through [facility] until approximately 5-6 months ago when he was lost to follow-up.  Since that period of time, the patient had been getting opioids "off the street".  However in the past 3-4 days, he had complained of worsening back pain, and he was not able to get his opioids.  As a result, the patient began taking up to 40 tablets (200mg ) of ibuprofen in the last 48 hours for his pain.  On the evening of 1419, the patient began having epigastric pain, nausea, and vomiting.  He had coffee grounds emesis and occasionally some bloody.  In addition, the patient had been having melanotic stools and occasional hematochezia over the last 24-48 hours.  On the morning of 07/29/2017, after taking a hot shower, the patient had a syncopal episode getting out of the bathtub.  The patient's mother found the patient on the floor.  There was no loss of bowel or bladder incontinence.  There is no postictal state.  The patient was able to walk downstairs with some assistance from his father.  Because he continued to have hematochezia in the setting of syncopal episode, EMS was activated.  Apparently, the patient hit his head against the side of the tub during his syncopal episode.  The patient also endorsed using cocaine approximately 3 days prior to this admission.  The patient also drinks 2-3 beers per week and a couple of mixed drinks.  He denies any tobacco or other illegal drug use.  He denied any fevers, headache, neck pain,  dysuria, hematuria.  He had epigastric abdominal pain.  In the emergency department, the patient was afebrile hemodynamically stable.  He was initially tachycardic in the 130s.  After 2 L normal saline, the patient's heart rate improved to 100-110.  CBC showed WBC 21.8, hemoglobin 13.9, platelets 277.  AST 36, ALT 46, lipase 32.  BMP showed potassium 3.1 with serum creatinine 1.16 and BUN 35.  Urinalysis was negative for pyuria.  FOBT was positive.  CT of the brain was negative for any acute findings.  Assessment/Plan: Upper GI bleed -Likely NSAID induced -Start Protonix drip -GI consult -Trend hemoglobin -Clear liquid diet -Check coags -type and screen -IVF  -baseline Hgb 14-15  Syncope -Likely due to orthostasis/vagal reaction -Placed on telemetry -Echocardiogram  Epigastric pain/chest pain -Lipase 32 -Likely NSAID induced gastritis/esophagitis -EKG without concerning ischemic changes -Chest x-ray  Polysubstance abuse -Alcohol withdrawal protocol -Including alcohol, cocaine, opioids -hep C antibody -hep B surface antigen  Leukocytosis -Likely stress demargination -Urinalysis negative for pyuria -Chest x-ray -Monitor clinically off antibiotics -am CBC  History of seizure disorder -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 Adult attention deficit disorder   . Anxiety   . Depression   . Seizures (HCC)    History reviewed. No pertinent surgical history. Social History:  reports that he has never smoked. He has never used smokeless tobacco. He reports that he  drinks alcohol. He reports that he has current or past drug history. Drug: Cocaine.   History reviewed. No pertinent family history.   Allergies  Allergen Reactions  . Pseudoephedrine Palpitations  . Pseudophed-Chlophedianol-Gg Palpitations     Prior to Admission medications     Medication Sig Start Date End Date Taking? Authorizing Provider  gabapentin (NEURONTIN) 600 MG tablet Take 600 mg by mouth 3 (three) times daily.   Yes [provider]  lisdexamfetamine (VYVANSE) 50 MG capsule Take 50 mg by mouth daily.   Yes [provider]    Review of Systems:  Constitutional:  No weight loss, night sweats, Fevers, chills, fatigue.  Head&Eyes: No headache.  No vision loss.  No eye pain or scotoma ENT:  No Difficulty swallowing,Tooth/dental problems,Sore throat,  No ear ache, post nasal drip,  Cardio-vascular:  No  Orthopnea, PND, swelling in lower extremities,  dizziness, palpitations  GI:  No   diarrhea, loss of appetite, heartburn, indigestion, Resp:   No cough. No coughing up of blood .No wheezing.No chest wall deformity  Skin:  no rash or lesions.  GU:  no dysuria, change in color of urine, no urgency or frequency. No flank pain.  Musculoskeletal:  No joint pain or swelling. No decreased range of motion. No back pain.  Psych:  No change in mood or affect. No depression or anxiety. Neurologic: No headache, no dysesthesia, no focal weakness, no vision loss. No syncope  Physical Exam: Vitals:   07/29/17 1630 07/29/17 1645 07/29/17 1700 07/29/17 1730  BP: 107/85  (!) 121/96 (!) 135/92  Pulse:  (!) 111    Resp: (!) 36 (!) 28 (!) 30 (!) 30  Temp:      TempSrc:      SpO2:  100%    Weight:      Height:       General:  A&O x 3, NAD, nontoxic, pleasant/cooperative Head/Eye: No conjunctival hemorrhage, no icterus, [place]/AT, No nystagmus ENT:  No icterus,  No thrush, good dentition, no pharyngeal exudate Neck:  No masses, no lymphadenpathy, no bruits CV:  RRR, no rub, no gallop, no S3 Lung:  CTAB, good air movement, no wheeze, no rhonchi Abdomen: soft/epigastric pain, +BS, nondistended, no peritoneal signs Ext: No cyanosis, No rashes, No petechiae, No lymphangitis, No edema Neuro: CNII-XII intact, strength 4/5 in bilateral upper and lower  extremities, no dysmetria  Labs on Admission:  Basic Metabolic Panel: Recent Labs  Lab 07/29/17 1635  NA 140  K 3.1*  CL 106  CO2 22  GLUCOSE 147*  BUN 35*  CREATININE 1.16  CALCIUM 8.7*   Liver Function Tests: Recent Labs  Lab 07/29/17 1635  AST 36  ALT 46  ALKPHOS 60  BILITOT 0.7  PROT 6.7  ALBUMIN 4.1   Recent Labs  Lab 07/29/17 1635  LIPASE 32   No results for input(s): AMMONIA in the last 168 hours. CBC: Recent Labs  Lab 07/29/17 1635  WBC 21.8*  NEUTROABS 18.1*  HGB 13.9  HCT 40.8  MCV 88.7  PLT 377   Coagulation Profile: No results for input(s): INR, PROTIME in the last 168 hours. Cardiac Enzymes: No results for input(s): CKTOTAL, CKMB, CKMBINDEX, TROPONINI in the last 168 hours. BNP: Invalid input(s): POCBNP CBG: No results for input(s): GLUCAP in the last 168 hours. Urine analysis:    Component Value Date/Time   COLORURINE YELLOW 07/29/2017 1620   APPEARANCEUR CLEAR 07/29/2017 1620   LABSPEC 1.027 07/29/2017 1620   PHURINE 6.0 07/29/2017 1620  GLUCOSEU NEGATIVE 07/29/2017 1620   HGBUR NEGATIVE 07/29/2017 1620   BILIRUBINUR NEGATIVE 07/29/2017 1620   KETONESUR NEGATIVE 07/29/2017 1620   PROTEINUR NEGATIVE 07/29/2017 1620   UROBILINOGEN 0.2 11/03/2012 0700   NITRITE NEGATIVE 07/29/2017 1620   LEUKOCYTESUR NEGATIVE 07/29/2017 1620   Sepsis Labs: @LABRCNTIP (procalcitonin:4,lacticidven:4) )No results found for this or any previous visit (from the past 240 hour(s)).   Radiological Exams on Admission: Ct Head Wo Contrast  Result Date: 07/29/2017 CLINICAL DATA:  Loss of consciousness following bowel movement with fall and maxillofacial trauma. Emesis. Initial encounter. EXAM: CT HEAD WITHOUT CONTRAST TECHNIQUE: Contiguous axial images were obtained from the base of the skull through the vertex without intravenous contrast. COMPARISON:  12/18/2016 FINDINGS: Brain: There is no evidence of acute infarct, intracranial hemorrhage, mass, midline  shift, or extra-axial fluid collection. The ventricles and sulci are normal. Vascular: No hyperdense vessel. Skull: No fracture or suspicious osseous lesion. Sinuses/Orbits: Visualized paranasal sinuses and mastoid air cells are clear. Orbits are unremarkable. Other: None. IMPRESSION: Negative head CT. Electronically Signed   By: Sebastian Ache M.D.   On: 07/29/2017 17:48    EKG: Independently reviewed. Sinus, nonspecific T wave changes    Time spent:60 minutes Code Status:   FULL Family Communication:  Mother and father updatd at bedside Disposition Plan: expect 1-2 day hospitalization Consults called: GI DVT Prophylaxis: SCDs  Catarina Hartshorn, DO  [facility] Pager [phone]  If 7PM-7AM, please contact night-coverage [email or web address] Password [facility] 07/29/2017, 7:19 PM

## 2017-07-30 ENCOUNTER — Observation Stay (HOSPITAL_BASED_OUTPATIENT_CLINIC_OR_DEPARTMENT_OTHER): Payer: 59

## 2017-07-30 ENCOUNTER — Observation Stay (HOSPITAL_COMMUNITY): Payer: 59 | Admitting: Anesthesiology

## 2017-07-30 ENCOUNTER — Encounter (HOSPITAL_COMMUNITY): Payer: Self-pay | Admitting: Gastroenterology

## 2017-07-30 ENCOUNTER — Encounter (HOSPITAL_COMMUNITY)
Admission: EM | Disposition: A | Discharge status: Home / Self Care | Payer: Self-pay | Source: Home / Self Care | Attending: Internal Medicine

## 2017-07-30 DIAGNOSIS — F1020 Alcohol dependence, uncomplicated: Secondary | ICD-10-CM | POA: Diagnosis not present

## 2017-07-30 DIAGNOSIS — K922 Gastrointestinal hemorrhage, unspecified: Secondary | ICD-10-CM | POA: Diagnosis not present

## 2017-07-30 DIAGNOSIS — D62 Acute posthemorrhagic anemia: Secondary | ICD-10-CM | POA: Diagnosis not present

## 2017-07-30 DIAGNOSIS — R55 Syncope and collapse: Secondary | ICD-10-CM | POA: Diagnosis not present

## 2017-07-30 DIAGNOSIS — F1410 Cocaine abuse, uncomplicated: Secondary | ICD-10-CM | POA: Diagnosis not present

## 2017-07-30 HISTORY — PX: ESOPHAGOGASTRODUODENOSCOPY (EGD) WITH PROPOFOL: SHX5813

## 2017-07-30 LAB — BASIC METABOLIC PANEL
Anion gap: 10 (ref 5–15)
BUN: 33 mg/dL — ABNORMAL HIGH (ref 6–20)
CHLORIDE: 107 mmol/L (ref 101–111)
CO2: 20 mmol/L — ABNORMAL LOW (ref 22–32)
CREATININE: 0.94 mg/dL (ref 0.61–1.24)
Calcium: 7.7 mg/dL — ABNORMAL LOW (ref 8.9–10.3)
Glucose, Bld: 141 mg/dL — ABNORMAL HIGH (ref 65–99)
Potassium: 3.2 mmol/L — ABNORMAL LOW (ref 3.5–5.1)
SODIUM: 137 mmol/L (ref 135–145)

## 2017-07-30 LAB — CBC
HCT: 34.1 % — ABNORMAL LOW (ref 39.0–52.0)
HEMATOCRIT: 27.1 % — AB (ref 39.0–52.0)
HEMATOCRIT: 22 % — AB (ref 39.0–52.0)
HEMOGLOBIN: 9.3 g/dL — AB (ref 13.0–17.0)
HEMOGLOBIN: 7.7 g/dL — AB (ref 13.0–17.0)
Hemoglobin: 11.6 g/dL — ABNORMAL LOW (ref 13.0–17.0)
MCH: 30.4 pg (ref 26.0–34.0)
MCH: 30.3 pg (ref 26.0–34.0)
MCH: 30.9 pg (ref 26.0–34.0)
MCHC: 34 g/dL (ref 30.0–36.0)
MCHC: 34.3 g/dL (ref 30.0–36.0)
MCHC: 35 g/dL (ref 30.0–36.0)
MCV: 89.5 fL (ref 78.0–100.0)
MCV: 88.3 fL (ref 78.0–100.0)
MCV: 88.4 fL (ref 78.0–100.0)
Platelets: 184 10*3/uL (ref 150–400)
Platelets: 305 10*3/uL (ref 150–400)
Platelets: 272 10*3/uL (ref 150–400)
RBC: 3.81 MIL/uL — AB (ref 4.22–5.81)
RBC: 3.07 MIL/uL — AB (ref 4.22–5.81)
RBC: 2.49 MIL/uL — ABNORMAL LOW (ref 4.22–5.81)
RDW: 12.1 % (ref 11.5–15.5)
RDW: 12 % (ref 11.5–15.5)
RDW: 12.2 % (ref 11.5–15.5)
WBC: 16.3 10*3/uL — AB (ref 4.0–10.5)
WBC: 15.5 10*3/uL — AB (ref 4.0–10.5)
WBC: 13.8 10*3/uL — ABNORMAL HIGH (ref 4.0–10.5)

## 2017-07-30 LAB — HEMOGLOBIN A1C
Hgb A1c MFr Bld: 5.5 % (ref 4.8–5.6)
MEAN PLASMA GLUCOSE: 111.15 mg/dL

## 2017-07-30 LAB — RAPID URINE DRUG SCREEN, HOSP PERFORMED
AMPHETAMINES: NOT DETECTED
BENZODIAZEPINES: POSITIVE — AB
Barbiturates: NOT DETECTED
COCAINE: NOT DETECTED
OPIATES: POSITIVE — AB
TETRAHYDROCANNABINOL: NOT DETECTED

## 2017-07-30 LAB — MAGNESIUM: MAGNESIUM: 1.5 mg/dL — AB (ref 1.7–2.4)

## 2017-07-30 LAB — ECHOCARDIOGRAM COMPLETE
HEIGHTINCHES: 68 in
Weight: 2720 oz

## 2017-07-30 LAB — PREPARE RBC (CROSSMATCH)

## 2017-07-30 LAB — LACTIC ACID, PLASMA: Lactic Acid, Venous: 2 mmol/L (ref 0.5–1.9)

## 2017-07-30 SURGERY — ESOPHAGOGASTRODUODENOSCOPY (EGD) WITH PROPOFOL
Anesthesia: Monitor Anesthesia Care

## 2017-07-30 MED ORDER — ONDANSETRON HCL 4 MG/2ML IJ SOLN
INTRAMUSCULAR | Status: AC
  Filled 2017-07-30: qty 2

## 2017-07-30 MED ORDER — POTASSIUM CHLORIDE CRYS ER 20 MEQ PO TBCR
40.0000 meq | EXTENDED_RELEASE_TABLET | Freq: Once | ORAL | Status: DC
  Filled 2017-07-30: qty 2

## 2017-07-30 MED ORDER — SODIUM CHLORIDE 0.9 % IV SOLN: INTRAVENOUS | Status: DC

## 2017-07-30 MED ORDER — SUCRALFATE 1 GM/10ML PO SUSP
1.0000 g | Freq: Three times a day (TID) | ORAL | Status: DC
  Administered 2017-07-30: 1 g via ORAL
  Filled 2017-07-30 (×3): qty 10

## 2017-07-30 MED ORDER — PROPOFOL 10 MG/ML IV BOLUS
INTRAVENOUS | Status: DC | PRN
  Administered 2017-07-30 (×4): 20 mg via INTRAVENOUS

## 2017-07-30 MED ORDER — PANTOPRAZOLE SODIUM 40 MG IV SOLR
40.0000 mg | Freq: Two times a day (BID) | INTRAVENOUS | Status: DC
  Administered 2017-07-30 – 2017-08-02 (×6): 40 mg via INTRAVENOUS
  Filled 2017-07-30 (×7): qty 40

## 2017-07-30 MED ORDER — ONDANSETRON HCL 4 MG/2ML IJ SOLN
INTRAMUSCULAR | Status: DC | PRN
  Administered 2017-07-30: 4 mg via INTRAVENOUS

## 2017-07-30 MED ORDER — PROCHLORPERAZINE EDISYLATE 5 MG/ML IJ SOLN
10.0000 mg | Freq: Four times a day (QID) | INTRAMUSCULAR | Status: DC | PRN
  Administered 2017-07-31: 10 mg via INTRAVENOUS
  Filled 2017-07-30: qty 2

## 2017-07-30 MED ORDER — STERILE WATER FOR IRRIGATION IR SOLN
Status: DC | PRN
  Administered 2017-07-30: 15 mL

## 2017-07-30 MED ORDER — ONDANSETRON HCL 4 MG/2ML IJ SOLN
4.0000 mg | Freq: Four times a day (QID) | INTRAMUSCULAR | Status: AC
  Administered 2017-07-30 – 2017-07-31 (×2): 4 mg via INTRAVENOUS
  Filled 2017-07-30 (×2): qty 2

## 2017-07-30 MED ORDER — PROPOFOL 500 MG/50ML IV EMUL
INTRAVENOUS | Status: DC | PRN
  Administered 2017-07-30 (×2): 175 ug/kg/min via INTRAVENOUS

## 2017-07-30 MED ORDER — MAGNESIUM SULFATE 4 GM/100ML IV SOLN
4.0000 g | Freq: Once | INTRAVENOUS | Status: AC
  Administered 2017-07-30: 4 g via INTRAVENOUS
  Filled 2017-07-30: qty 100

## 2017-07-30 MED ORDER — SODIUM CHLORIDE 0.9 % IV SOLN
Freq: Once | INTRAVENOUS | Status: AC
  Administered 2017-07-30: via INTRAVENOUS

## 2017-07-30 MED ORDER — LIDOCAINE VISCOUS 2 % MT SOLN
OROMUCOSAL | Status: AC
  Filled 2017-07-30: qty 15

## 2017-07-30 MED ORDER — LACTATED RINGERS IV SOLN: INTRAVENOUS | Status: DC

## 2017-07-30 NOTE — Progress Notes (Addendum)
[facility] of the nurse tech.  Patient was alert, vitals were taken and were stable.  Notified MD of patient episode.  MD came to floor to assess patient.  Will watch labs and continue to monitor changes and notify staff as appropriate.

## 2017-07-30 NOTE — Progress Notes (Signed)
PROGRESS NOTE    Ronnald NianCharles A Montesdeoca  UUV:[id number] DOB: Feb 09, 1981 DOA: 07/29/2017 PCP: System, Pcp Not In   Brief Narrative:  37 year old male with a history of opiate addiction, depression/anxiety and polysubstance abuse, presented to the hospital with 1 day history of nausea and vomiting as well as hematochezia and syncope.  Patient had taken a large amount of NSAIDs due to worsening back pain.  He was admitted to the hospital for upper GI bleed likely induced by NSAIDs.  He underwent EGD that confirmed NSAID induced injury.  Hemoglobin is being monitored.  He is on PPI as well as Carafate.  Electrolytes are being corrected.  No signs of alcohol withdrawal at this time.   Assessment & Plan:   Active Problems:   Upper GI bleed   Hematochezia   Syncope, vasovagal   Cocaine abuse (HCC)   Opioid abuse (HCC)   Alcohol dependence (HCC)   Acute GI bleeding   Acute blood loss anemia   1. Upper GI bleeding.  Related to NSAID use.  Initially started on Protonix drip.  Seen by GI and underwent endoscopy with results below.  Protonix has been changed to twice daily.  Continue to trend hemoglobin.  Liquid diet for now.  Advance as tolerated.  He will need to avoid all further NSAIDs.  He is also been started on Carafate. 2. Acute blood loss anemia.  Continue to follow serial hemoglobins. 3. Epigastric pain/chest pain.  Related to NSAID-induced gastritis/duodenitis.  Lipase normal. 4. Syncope.  Related to orthostasis/vagal reaction.  Echocardiogram is unremarkable.  Continue to monitor. 5. Leukocytosis.  Suspect stress demargination.  No evidence of infection at this time.  Continue to monitor. 6. Hypokalemia.  Replace.  Magnesium will also be replaced. 7. Polysubstance abuse.  He is on CIWA protocol for signs of alcohol withdrawal.  He was also found to be cocaine positive and has been using opiates off the street.  He will need to set up with a rehab program/Suboxone clinic again.  Will request  social work to offer resources.   DVT prophylaxis: SCDs Code Status: Full code Family Communication: Discussed with mother at the bedside Disposition Plan: Discharge home once hemoglobin has stabilized.   Consultants:   Gastroenterology  Procedures:  EGD:  - Mild erosive reflux esophagitis, .                           - Non-bleeding gastric ulcers with no stigmata of                            bleeding. Biopsied.                           - Severe inflammatory changes including erosion and                            extensive ulceration of the duodenum as described.                            Biopsied. Findings in the duodenum and stomach most                            likely secondary to NSAID insult     Antimicrobials:  Subjective: He is not had any further vomiting.  Continues to have dark stools.  No abdominal pain.  Objective: Vitals:   07/30/17 1445 07/30/17 1447 07/30/17 1500 07/30/17 1515  BP: (!) 130/104 (!) 106/53 105/72 129/81  Pulse:   (!) 101 88  Resp: 18 18 17  (!) 22  Temp: 97.8 F (36.6 C)     TempSrc:      SpO2: 99%  100% 100%  Weight:      Height:        Intake/Output Summary (Last 24 hours) at 07/30/2017 1826 Last data filed at 07/30/2017 1443 Gross per 24 hour  Intake 885 ml  Output 800 ml  Net 85 ml   Filed Weights   07/29/17 1543  Weight: 77.1 kg (170 lb)    Examination:  General exam: Appears mildly anxious, no distress Respiratory system: Clear to auscultation. Respiratory effort normal. Cardiovascular system: S1 & S2 heard, RRR. No JVD, murmurs, rubs, gallops or clicks. No pedal edema. Gastrointestinal system: Abdomen is nondistended, soft and nontender. No organomegaly or masses felt. Normal bowel sounds heard. Central nervous system: Alert and oriented. No focal neurological deficits. Extremities: Symmetric 5 x 5 power. Skin: No rashes, lesions or ulcers Psychiatry: Appears mildly anxious, but cooperative with  exam    Data Reviewed: I have personally reviewed following labs and imaging studies  CBC: Recent Labs  Lab 07/29/17 1635 07/29/17 2241 07/30/17 0457  WBC 21.8* 16.3* 15.5*  NEUTROABS 18.1*  --   --   HGB 13.9 11.6* 9.3*  HCT 40.8 34.1* 27.1*  MCV 88.7 89.5 88.3  PLT 377 184 305   Basic Metabolic Panel: Recent Labs  Lab 07/29/17 1635 07/30/17 0457  NA 140 137  K 3.1* 3.2*  CL 106 107  CO2 22 20*  GLUCOSE 147* 141*  BUN 35* 33*  CREATININE 1.16 0.94  CALCIUM 8.7* 7.7*  MG  --  1.5*   GFR: Estimated Creatinine Clearance: 104.1 mL/min (by C-G formula based on SCr of 0.94 mg/dL). Liver Function Tests: Recent Labs  Lab 07/29/17 1635  AST 36  ALT 46  ALKPHOS 60  BILITOT 0.7  PROT 6.7  ALBUMIN 4.1   Recent Labs  Lab 07/29/17 1635  LIPASE 32   No results for input(s): AMMONIA in the last 168 hours. Coagulation Profile: Recent Labs  Lab 07/29/17 2241  INR 1.17   Cardiac Enzymes: No results for input(s): CKTOTAL, CKMB, CKMBINDEX, TROPONINI in the last 168 hours. BNP (last 3 results) No results for input(s): PROBNP in the last 8760 hours. HbA1C: Recent Labs    07/29/17 2241  HGBA1C 5.5   CBG: No results for input(s): GLUCAP in the last 168 hours. Lipid Profile: No results for input(s): CHOL, HDL, LDLCALC, TRIG, CHOLHDL, LDLDIRECT in the last 72 hours. Thyroid Function Tests: No results for input(s): TSH, T4TOTAL, FREET4, T3FREE, THYROIDAB in the last 72 hours. Anemia Panel: No results for input(s): VITAMINB12, FOLATE, FERRITIN, TIBC, IRON, RETICCTPCT in the last 72 hours. Sepsis Labs: Recent Labs  Lab 07/29/17 1706 07/30/17 0457  LATICACIDVEN 2.91* 2.0*    No results found for this or any previous visit (from the past 240 hour(s)).       Radiology Studies: Dg Chest 2 View  Result Date: 07/29/2017 CLINICAL DATA:  Nausea and vomiting EXAM: CHEST - 2 VIEW COMPARISON:  12/18/2016 FINDINGS: The heart size and mediastinal contours are within  normal limits. Both lungs are clear. The visualized skeletal structures are unremarkable. IMPRESSION: No active cardiopulmonary  disease. Electronically Signed   By: Deatra Robinson M.D.   On: 07/29/2017 22:47   Ct Head Wo Contrast  Result Date: 07/29/2017 CLINICAL DATA:  Loss of consciousness following bowel movement with fall and maxillofacial trauma. Emesis. Initial encounter. EXAM: CT HEAD WITHOUT CONTRAST TECHNIQUE: Contiguous axial images were obtained from the base of the skull through the vertex without intravenous contrast. COMPARISON:  12/18/2016 FINDINGS: Brain: There is no evidence of acute infarct, intracranial hemorrhage, mass, midline shift, or extra-axial fluid collection. The ventricles and sulci are normal. Vascular: No hyperdense vessel. Skull: No fracture or suspicious osseous lesion. Sinuses/Orbits: Visualized paranasal sinuses and mastoid air cells are clear. Orbits are unremarkable. Other: None. IMPRESSION: Negative head CT. Electronically Signed   By: Sebastian Ache M.D.   On: 07/29/2017 17:48        Scheduled Meds: . folic acid  1 mg Oral Daily  . gabapentin  600 mg Oral TID  . lidocaine  1 patch Transdermal Q24H  . lisdexamfetamine  50 mg Oral Daily  . multivitamin with minerals  1 tablet Oral Daily  . ondansetron (ZOFRAN) IV  4 mg Intravenous Q6H  . pantoprazole (PROTONIX) IV  40 mg Intravenous Q12H  . potassium chloride  40 mEq Oral Once  . sucralfate  1 g Oral TID WC & HS  . thiamine  100 mg Oral Daily   Or  . thiamine  100 mg Intravenous Daily   Continuous Infusions: . 0.9 % NaCl with KCl 40 mEq / L 75 mL/hr (07/30/17 1239)  . lactated ringers 125 mL/hr at 07/29/17 1848  . sodium chloride       LOS: 0 days    Time spent:    [name], MD [facility] Pager [phone]  If 7PM-7AM, please contact night-coverage [email or web address] Password [facility] 07/30/2017, 6:26 PM

## 2017-07-30 NOTE — Progress Notes (Signed)
Held AM meds because pt is scheduled for an EGD

## 2017-07-30 NOTE — Progress Notes (Signed)
CRITICAL VALUE ALERT  Critical Value: lactic acid 2.0  Date & Time Notied:  07-30-17   Provider Notified:   Orders Received/Actions taken: patient's lactic acid decreasing, will continue to monitor patient.

## 2017-07-30 NOTE — Progress Notes (Signed)
Gave pt PO medications and he vomited. Gave IV  Zofran for nausea + vomiting.

## 2017-07-30 NOTE — Progress Notes (Signed)
Pt c/o nausea and dry heaving. Pt states zofran is not working, Arlyss Queen. Smith, Md paged to see if he would order a dose of phenergen per pt request. Waiting for orders/call back.

## 2017-07-30 NOTE — Plan of Care (Signed)
progressing 

## 2017-07-30 NOTE — Anesthesia Postprocedure Evaluation (Signed)
Anesthesia Post Note  Patient: Lawerance Bach  Procedure(s) Performed: ESOPHAGOGASTRODUODENOSCOPY (EGD) WITH PROPOFOL (N/A )  Patient location during evaluation: PACU Anesthesia Type: MAC Level of consciousness: awake and alert and patient cooperative Pain management: satisfactory to patient Vital Signs Assessment: post-procedure vital signs reviewed and stable Respiratory status: spontaneous breathing Cardiovascular status: stable Postop Assessment: no apparent nausea or vomiting Anesthetic complications: no     Last Vitals:  Vitals:   07/30/17 1445 07/30/17 1447  BP: (!) 130/104 (!) 106/53  Pulse:    Resp: 18 18  Temp: 36.6 C   SpO2: 99%     Last Pain:  Vitals:   07/30/17 1445  TempSrc:   PainSc: 0-No pain                 Georgana Romain

## 2017-07-30 NOTE — Discharge Instructions (Addendum)
AVOID ALL NSAIDS (ASPIRIN, IBUPROFEN, GOODY POWDER)    Soft-Food Meal Plan A soft-food meal plan includes foods that are safe and easy to swallow. This meal plan typically is used:  If you are having trouble chewing or swallowing foods.  As a transition meal plan after only having had liquid meals for a long period.  What do I need to know about the soft-food meal plan? A soft-food meal plan includes tender foods that are soft and easy to chew and swallow. In most cases, bite-sized pieces of food are easier to swallow. A bite-sized piece is about  inch or smaller. Foods in this plan do not need to be ground or pureed. Foods that are very hard, crunchy, or sticky should be avoided. Also, breads, cereals, yogurts, and desserts with nuts, seeds, or fruits should be avoided. What foods can I eat? Grains Rice and wild rice. Moist bread, dressing, pasta, and noodles. Well-moistened dry or cooked cereals, such as farina (cooked wheat cereal), oatmeal, or grits. Biscuits, breads, muffins, pancakes, and waffles that have been well moistened. Vegetables Shredded lettuce. Cooked, tender vegetables, including potatoes without skins. Vegetable juices. Broths or creamed soups made with vegetables that are not stringy or chewy. Strained tomatoes (without seeds). Fruits Canned or well-cooked fruits. Soft (ripe), peeled fresh fruits, such as peaches, nectarines, kiwi, cantaloupe, honeydew melon, and watermelon (without seeds). Soft berries with small seeds, such as strawberries. Fruit juices (without pulp). Meats and Other Protein Sources Moist, tender, lean beef. Mutton. Lamb. Veal. Chicken. [place]. Liver. Ham. Fish without bones. Eggs. Dairy Milk, milk drinks, and cream. Plain cream cheese and cottage cheese. Plain yogurt. Sweets/Desserts Flavored gelatin desserts. Custard. Plain ice cream, frozen yogurt, sherbet, milk shakes, and malts. Plain cakes and cookies. Plain hard candy. Other Butter,  margarine (without trans fat), and cooking oils. Mayonnaise. Cream sauces. Mild spices, salt, and sugar. Syrup, molasses, honey, and jelly. The items listed above may not be a complete list of recommended foods or beverages. Contact your dietitian for more options. What foods are not recommended? Grains Dry bread, toast, crackers that have not been moistened. Coarse or dry cereals, such as bran, granola, and shredded wheat. Tough or chewy crusty breads, such as [place] bread or baguettes. Vegetables Corn. Raw vegetables except shredded lettuce. Cooked vegetables that are tough or stringy. Tough, crisp, fried potatoes and potato skins. Fruits Fresh fruits with skins or seeds or both, such as apples, pears, or grapes. Stringy, high-pulp fruits, such as papaya, pineapple, coconut, or mango. Fruit leather, fruit roll-ups, and all dried fruits. Meats and Other Protein Sources Sausages and hot dogs. Meats with gristle. Fish with bones. Nuts, seeds, and chunky peanut or other nut butters. Sweets/Desserts Cakes or cookies that are very dry or chewy. The items listed above may not be a complete list of foods and beverages to avoid. Contact your dietitian for more information. This information is not intended to replace advice given to you by your health care provider. Make sure you discuss any questions you have with your health care provider. Document Released: 07/22/2007 Document Revised: 09/20/2015 Document Reviewed: 03/11/2013 Elsevier Interactive Patient Education  2017 [employer].    Finding Treatment for Addiction What is addiction? Addiction is a complex disease of the brain. It causes an uncontrollable (compulsive) need for a substance. You can be addicted to alcohol, illegal drugs, or prescription medicines such as painkillers. Addiction can also be a behavior, like gambling or shopping. The need for the drug or activity can become  so strong that you think about it all the time. You can also  become physically dependent on a substance. Addiction can change the way your brain works. Because of these changes, getting more of whatever you are addicted to becomes the most important thing to you and feels better than other activities or relationships. Addiction can lead to changes in health, behavior, emotions, relationships, and choices that affect you and everyone around you. How do I know if I need treatment for addiction? Addiction is a progressive disease. Without treatment, addiction can get worse. Living with addiction puts you at higher risk for injury, poor health, lost employment, loss of money, and even death. You might need treatment for addiction if:  You have tried to stop or cut down, but you cannot.  Your addiction is causing physical health problems.  You find it annoying that your friends and family are concerned about your alcohol or substance use.  You feel guilty about substance abuse or a compulsive behavior.  You have lied or tried to hide your addiction.  You need a particular substance or activity to start your day or to calm down.  You are getting in trouble at school, work, home, or with the police.  You have done something illegal to support your addiction.  You are running out of money because of your addiction.  You have no time for anything other than your addiction.  What types of treatment are available? The treatment program that is right for you will depend on many factors, including the type of addiction you have. Treatment programs can be outpatient or inpatient. In an outpatient program, you live at home and go to work or school, but you also go to a clinic for treatment. With an inpatient program, you live and sleep at the program facility during treatment. After treatment, you might need a plan for support during recovery. Other treatment options include:  Medicine. ? Some addictions may be treated with prescription medicines. ? You might  also need medicine to treat anxiety or depression.  Counseling and behavior therapy. Therapy can help individuals and families behave in healthier ways and relate more effectively.  Support groups. Confidential group therapy, such as a 12-step program, can help individuals and families during treatment and recovery.  No single type of program is right for everyone. Many treatment programs involve a combination of education, counseling, and a 12-step, spiritually-based approach. Some treatment programs are government sponsored. They are geared for patients who do not have private insurance. Treatment programs can vary in many respects, such as:  Cost and types of insurance that are accepted.  Types of on-site medical services that are offered.  Length of stay, setting, and size.  Overall philosophy of treatment.  What should I consider when selecting a treatment program? It is important to think about your individual requirements when selecting a treatment program. There are a number of things to consider, such as:  If the program is certified by the appropriate government agency. Even private programs must be certified and employ certified professionals.  If the program is covered by your insurance. If finances are a concern, the first call you should make is to your insurance company, if you have health insurance. Ask for a list of treatment programs that are in your network, and confirm any copayments and deductibles that you may have to pay. ? If you do not have insurance, or if you choose to attend a program that does not accept your  insurance, discuss whether a payment plan can be set up.  If treatment is available in languages other than English, if needed.  If the program offers detoxification treatment, if needed.  If 12-step meetings are held at the center or if transport is available for patients to attend meetings at other locations.  If the program is professional,  organized, and clean.  If the program meets all of your needs, including physical and cultural needs.  If the facility offers specific treatment for your particular addiction.  If support continues to be offered after you have left the program.  If your treatment plan is continually looked at to make sure you are receiving the right treatment at the right time.  If mental health counseling is part of your treatment.  If medicine is included in treatment, if needed.  If your family is included in your treatment plan and if support is offered to them throughout the treatment process.  How the treatment works to prevent relapse.  Where else can I get help?  Your health care provider. Ask him or her to help you find addiction treatment. These discussions are confidential.  The [employer] on Alcoholism and Drug Dependence (NCADD). This group has information about treatment centers and programs for people who have an addiction and for family members. ? The telephone number is 1-800-NCA-CALL (971-828-6860). ? The website is https://ncadd.org/about-ncadd/our-affiliates  The Substance Abuse and Mental Health Services Administration [facility]). This group will help you find publicly funded treatment centers, help hotlines, and counseling services near you. ? The telephone number is 1-800-662-HELP (704-469-6932). ? The website is www.findtreatment.[employer].pl In countries outside of the [place]. and [place], look in [employer] for contact information for services in your area. This information is not intended to replace advice given to you by your health care provider. Make sure you discuss any questions you have with your health care provider. Document Released: 03/13/2005 Document Revised: 03/10/2016 Document Reviewed: 01/31/2014 Elsevier Interactive Patient Education  2017 [employer].   Addiction and the Family What is addiction? Addiction is a complex disease of the brain.  It causes an uncontrollable (compulsive) need for a substance. You can be addicted to alcohol or illegal drugs or to prescription medicines, such as painkillers. Addiction can also be a behavior, like gambling or shopping. The need for the drug or activity can become so strong that you think about it all the time. You can also become physically dependent on a substance. Addiction can change the way your brain works. Because of these changes, getting more of whatever you are addicted to becomes the most important thing to you and feels better than other activities or relationships. Addiction can lead to changes in health, behavior, emotions, relationships, and choices that affect you and everyone around you. What are common signs of addiction? Addiction can cause behavioral and emotional signs, as well as physical signs. Behavioral signs of addiction may include:  Having an intense craving for whatever you are addicted to.  Always thinking about your addiction.  Planning your life around your addiction.  Being unable to stop using a substance or participating in a behavior.  Devoting more time to the addiction. This might mean you no longer go to school or work or spend time with people you enjoy.  Having an increasing need for money. An addiction might make you ask people for unusual loans or steal items to sell.  Having exaggerated emotional responses to difficult situations. These may include: ? Feeling anxious  or stressed out. ? Extreme irritability. ? Aggression. ? Lying.  Continuing with the addiction even after it has caused a bad outcome, such as: ? Poor health. ? Damaged relationships. ? Money loss. ? Job loss. ? Getting hurt or arrested.  Having trouble being realistic about the negative effects of addiction.  Physical signs of addiction may include:  Bloodshot eyes.  Nosebleeds.  Poor hygiene.  Changing sleep patterns.  Shakes and tremors.  Slurred  speech.  Confusion.  Unconsciousness.  How can addiction affect family members? Addiction affects everyone in a family. It touches all aspects of family life, including finances, communication, work, school, and free time. Children of an addict might have:  Birth defects, if the mother was addicted during pregnancy.  Physical, emotional, and behavioral problems.  Trouble in school.  Injury from violence, abuse, or accidents.  Problems because of neglect.  A greater risk for addiction later in life.  Parents of an addict might experience:  Confusion.  Worry.  Guilt.  Fear.  Sadness.  A desire to make the addiction seem less of a problem than it is.  Financial strain.  Feeling isolated from family and friends.  Physical health changes from stress or from violent confrontations.  Brothers, sisters, and extended family members of an addict might experience:  Worry.  Anger.  Fear.  Resentment.  Confusion.  A desire to hide the addiction and its impact.  Financial strain.  How do I know if treatment for addiction is needed? Addiction is a progressive disease. Without treatment, addiction can get worse. Living with addiction puts you at higher risk for injury, poor health, lost employment, loss of money, and even death. You might need treatment for addiction if:  You have tried to stop or cut down, and you cannot.  Your addiction is causing physical health problems.  You find it annoying that your friends and family are concerned about your alcohol or substance use.  You feel guilty about substance abuse or a behavior.  You have lied or tried to hide your addiction.  You need a particular substance to start your day or calm down.  You are getting in trouble at school, work, home, or with the police.  You have done something illegal to support your addiction.  You are running out of money because of your addiction.  You have no time for anything  other than your addiction.  What types of treatment options are available?  Treatment for the addict The treatment program that is right for you will depend on many factors, including the type of addiction you have. Treatment programs can be outpatient or inpatient. In an outpatient program, you live at home and go to work but also go to a clinic for treatment. With an inpatient program, you sleep and live at the program facility during treatment. After treatment, you might need a plan for support during recovery. Other treatment options include:  Medicine. ? Some addictions may be treated with prescription medicines. ? You might also need medicine to treat anxiety or depression.  Counseling and behavior therapy. Therapy can help individuals and families behave and relate more effectively.  Support groups. Confidential group therapy, such as twelve-step program, can help individuals and families during treatment and recovery.  Treatment for family members Addiction affects the entire family. Ask your health care provider or counselor to recommend resources for family members. You can call [employer] at (463)659-8063 or visit their website at http://www.nar-anon.org/find-a-group/ Where else can I get help?  Ask your health care provider for help finding addiction treatment. These discussions are confidential.  The [employer] on Alcoholism and Drug Dependence (NCADD). This group has information on treatment centers and programs for people who have an addiction and for family members. ? Their telephone number is 1-800-NCA-CALL. ? Their website is https://ncadd.org/affiliate-network/find-an-affiliate  The Substance Abuse and Mental Health Services Administration [facility]). This group will help you find publicly funded treatment centers, help hotlines, and counseling services near you. ? Their telephone number is 1-800-662-HELP (4357). ? Their website is www.findtreatment.[employer].pl This  information is not intended to replace advice given to you by your health care provider. Make sure you discuss any questions you have with your health care provider. Document Released: 12/19/2003 Document Revised: 03/31/2016 Document Reviewed: 07/04/2013 Elsevier Interactive Patient Education  2017 [employer].

## 2017-07-30 NOTE — Consult Note (Signed)
Referring Provider: [name], [name], MD Primary Care Physician:  System, Pcp Not In Primary Gastroenterologist:  Roetta SessionsMichael Rourk, MD  Reason for Consultation:  GI bleed  HPErick BlinksI: Adam Davenport is a 37 y.o. male with past medical history significant for opioid addiction, depression/anxiety, polysubstance abuse who presented to the emergency department with complaint of syncopal episode in the setting of bloody stools, nausea and vomiting at home.  Patient hit his face on the tub and had a nosebleed.   Per Epic, the patient had been on a buprenorphine and naloxone equivalent through [facility] until approximately 5-6 months ago when he was lost to follow-up.  Since that period of time, the patient had been getting opioids "off the street".  However in the past 3-4 days, he had complained of worsening back pain, and he was not able to get his opioids.  Therefore patient began taking ibuprofen over the last 48 hours for his pain, up to 40 tablets during that timeframe.  He developed epigastric pain, nausea/vomiting, hematemesis and melena.  Patient admits to cocaine use rarely, last time on Sunday.  Also drinks 2-3 beers per week as well as a couple mixed drinks.  His hemoglobin on presentation was 13.9, down to 9.3 today.  Platelets are normal at 305,000.  On admission his white blood cell count was 21,800, down to 15,500 today.  His lactic acid was closed to 3 and down to 2 today.  His BUN 35, creatinine 1.16 on admission.  Magnesium slightly low at 1.5.  Potassium low at 3.2.  LFTs unremarkable.  CT head and chest x-ray unremarkable.   Prior to Admission medications   Medication Sig Start Date End Date Taking? Authorizing Provider  gabapentin (NEURONTIN) 600 MG tablet Take 600 mg by mouth 3 (three) times daily.   Yes [provider]  lisdexamfetamine (VYVANSE) 50 MG capsule Take 50 mg by mouth daily.   Yes [provider]  Ibuprofen 200 mg, took up to 40 in the past 2  days  Current Facility-Administered Medications  Medication Dose Route Frequency Provider Last Rate Last Dose  . 0.9 % NaCl with KCl 40 mEq / L  infusion   Intravenous Continuous Tat, Onalee Huaavid, MD 75 mL/hr at 07/29/17 2243 75 mL/hr at 07/29/17 2243  . acetaminophen (TYLENOL) tablet 650 mg  650 mg Oral Q6H PRN Tat, David, MD       Or  . acetaminophen (TYLENOL) suppository 650 mg  650 mg Rectal Q6H PRN Tat, Onalee Huaavid, MD      . diphenhydrAMINE (BENADRYL) capsule 25 mg  25 mg Oral QHS PRN Catarina Hartshornat, David, MD   25 mg at 07/29/17 2244  . folic acid (FOLVITE) tablet 1 mg  1 mg Oral Daily Tat, David, MD      . gabapentin (NEURONTIN) capsule 600 mg  600 mg Oral TID Catarina Hartshornat, David, MD   600 mg at 07/29/17 2243  . lactated ringers infusion   Intravenous Continuous Derwood KaplanNanavati, Ankit, MD 125 mL/hr at 07/29/17 1848    . lidocaine (LIDODERM) 5 % 1 patch  1 patch Transdermal Q24H Derwood KaplanNanavati, Ankit, MD   1 patch at 07/29/17 1817  . lisdexamfetamine (VYVANSE) capsule 50 mg  50 mg Oral Daily Tat, David, MD      . LORazepam (ATIVAN) tablet 1 mg  1 mg Oral Q6H PRN Catarina Hartshornat, David, MD       Or  . LORazepam (ATIVAN) injection 1 mg  1 mg Intravenous Q6H PRN Catarina Hartshornat, David, MD      .  multivitamin with minerals tablet 1 tablet  1 tablet Oral Daily Tat, David, MD      . ondansetron [facility]) tablet 4 mg  4 mg Oral Q6H PRN Tat, David, MD       Or  . ondansetron (ZOFRAN) injection 4 mg  4 mg Intravenous Q6H PRN Catarina Hartshorn, MD   4 mg at 07/29/17 1958  . pantoprazole (PROTONIX) 80 mg in sodium chloride 0.9 % 100 mL IVPB  80 mg Intravenous Once Tat, David, MD      . pantoprazole (PROTONIX) 80 mg in sodium chloride 0.9 % 250 mL (0.32 mg/mL) infusion  8 mg/hr Intravenous Continuous Tat, David, MD 25 mL/hr at 07/30/17 0027 8 mg/hr at 07/30/17 0027  . [START ON 08/02/2017] pantoprazole (PROTONIX) injection 40 mg  40 mg Intravenous Q12H Tat, David, MD      . sodium chloride 0.9 % bolus 1,000 mL  1,000 mL Intravenous Once Tat, David, MD      . thiamine  (VITAMIN B-1) tablet 100 mg  100 mg Oral Daily Tat, David, MD       Or  . thiamine (B-1) injection 100 mg  100 mg Intravenous Daily Tat, Onalee Hua, MD        Allergies as of 07/29/2017 - Review Complete 07/29/2017  Allergen Reaction Noted  . Pseudoephedrine Palpitations 06/09/2011  . Pseudophed-chlophedianol-gg Palpitations 02/24/2011    Past Medical History:  Diagnosis Date  . Adult attention deficit disorder   . Anxiety   . Depression   . Opioid abuse (HCC)   . Polysubstance abuse (HCC)   . Seizures (HCC)     Past Surgical History:  Procedure Laterality Date  . VARICOCELE EXCISION      Family History  Problem Relation Age of Onset  . Colon cancer Neg Hx   . Liver disease Neg Hx     Social History   Socioeconomic History  . Marital status: Married    Spouse name: Not on file  . Number of children: Not on file  . Years of education: Not on file  . Highest education level: Not on file  Occupational History  . Not on file  Social Needs  . Financial resource strain: Not on file  . Food insecurity:    Worry: Not on file    Inability: Not on file  . Transportation needs:    Medical: Not on file    Non-medical: Not on file  Tobacco Use  . Smoking status: Never Smoker  . Smokeless tobacco: Never Used  Substance and Sexual Activity  . Alcohol use: Yes  . Drug use: Yes    Types: Cocaine    Comment: pain pills   . Sexual activity: Not on file  Lifestyle  . Physical activity:    Days per week: Not on file    Minutes per session: Not on file  . Stress: Not on file  Relationships  . Social connections:    Talks on phone: Not on file    Gets together: Not on file    Attends religious service: Not on file    Active member of club or organization: Not on file    Attends meetings of clubs or organizations: Not on file    Relationship status: Not on file  . Intimate partner violence:    Fear of current or ex partner: Not on file    Emotionally abused: Not on file     Physically abused: Not on file    Forced sexual  activity: Not on file  Other Topics Concern  . Not on file  Social History Narrative  . Not on file     ROS:  General: Negative for anorexia, weight loss, fever, chills, fatigue, weakness. Eyes: Negative for vision changes.  ENT: Negative for hoarseness, difficulty swallowing , nasal congestion. CV: Negative for chest pain, angina, palpitations, dyspnea on exertion, peripheral edema.  Respiratory: Negative for dyspnea at rest, dyspnea on exertion, cough, sputum, wheezing.  GI: See history of present illness. GU:  Negative for dysuria, hematuria, urinary incontinence, urinary frequency, nocturnal urination.  MS: Negative for joint pain.  Patient complains of new back pain starting about a week ago.  Derm: Negative for rash or itching.  Neuro: Negative for weakness, abnormal sensation, seizure, frequent headaches, memory loss, confusion.  Psych: Negative for anxiety, depression, suicidal ideation, hallucinations.  Endo: Negative for unusual weight change.  Heme: Negative for bruising or bleeding. Allergy: Negative for rash or hives.       Physical Examination: Vital signs in last 24 hours: Temp:  [97.7 F (36.5 C)-99.3 F (37.4 C)] 99.2 F (37.3 C) (04/04 0526) Pulse Rate:  [70-116] 106 (04/04 0526) Resp:  [14-36] 14 (04/04 0526) BP: (100-135)/(66-97) 112/66 (04/04 0526) SpO2:  [98 %-100 %] 100 % (04/04 0526) Weight:  [170 lb (77.1 kg)] 170 lb (77.1 kg) (04/03 1543) Last BM Date: 07/30/17  General: Well-nourished, well-developed in no acute distress.  Laceration of the bridge of his nose.  Drowsy but easily arousable.   Head: Normocephalic, atraumatic.   Eyes: Conjunctiva pink, no icterus. Mouth: Oropharyngeal mucosa moist and pink , no lesions erythema or exudate. Neck: Supple without thyromegaly, masses, or lymphadenopathy.  Lungs: Clear to auscultation bilaterally.  Heart: Regular rate and rhythm, no murmurs rubs or  gallops.  Abdomen: Bowel sounds are normal, mild epigastric tenderness, nondistended, no hepatosplenomegaly or masses, no abdominal bruits or    hernia , no rebound or guarding.   Rectal: Not performed Extremities: No lower extremity edema, clubbing, deformity.  Neuro: Alert and oriented x 4 , grossly normal neurologically.  Skin: Warm and dry, no rash or jaundice.   Psych: Alert and cooperative, normal mood and affect.        Intake/Output from previous day: 04/03 0701 - 04/04 0700 In: 1535 [I.V.:485; IV Piggyback:1050] Out: 500 [Urine:500] Intake/Output this shift: No intake/output data recorded.  Lab Results: CBC Recent Labs    07/29/17 1635 07/29/17 2241 07/30/17 0457  WBC 21.8* 16.3* 15.5*  HGB 13.9 11.6* 9.3*  HCT 40.8 34.1* 27.1*  MCV 88.7 89.5 88.3  PLT 377 184 305   BMET Recent Labs    07/29/17 1635 07/30/17 0457  NA 140 137  K 3.1* 3.2*  CL 106 107  CO2 22 20*  GLUCOSE 147* 141*  BUN 35* 33*  CREATININE 1.16 0.94  CALCIUM 8.7* 7.7*   LFT Recent Labs    07/29/17 1635  BILITOT 0.7  ALKPHOS 60  AST 36  ALT 46  PROT 6.7  ALBUMIN 4.1    Lipase Recent Labs    07/29/17 1635  LIPASE 32    PT/INR Recent Labs    07/29/17 2241  LABPROT 14.9  INR 1.17    Lactic acid 2.91 on admission, down to 2.0  Imaging Studies: Dg Chest 2 View  Result Date: 07/29/2017 CLINICAL DATA:  Nausea and vomiting EXAM: CHEST - 2 VIEW COMPARISON:  12/18/2016 FINDINGS: The heart size and mediastinal contours are within normal limits. Both lungs are clear.  The visualized skeletal structures are unremarkable. IMPRESSION: No active cardiopulmonary disease. Electronically Signed   By: Deatra Robinson M.D.   On: 07/29/2017 22:47   Ct Head Wo Contrast  Result Date: 07/29/2017 CLINICAL DATA:  Loss of consciousness following bowel movement with fall and maxillofacial trauma. Emesis. Initial encounter. EXAM: CT HEAD WITHOUT CONTRAST TECHNIQUE: Contiguous axial images were obtained  from the base of the skull through the vertex without intravenous contrast. COMPARISON:  12/18/2016 FINDINGS: Brain: There is no evidence of acute infarct, intracranial hemorrhage, mass, midline shift, or extra-axial fluid collection. The ventricles and sulci are normal. Vascular: No hyperdense vessel. Skull: No fracture or suspicious osseous lesion. Sinuses/Orbits: Visualized paranasal sinuses and mastoid air cells are clear. Orbits are unremarkable. Other: None. IMPRESSION: Negative head CT. Electronically Signed   By: Sebastian Ache M.D.   On: 07/29/2017 17:48  [4 week]   Impression: 37 year old gentleman presenting with syncopal episode in the setting of acute upper GI bleed.  Patient admits to abuse of ibuprofen over the last couple of days for new back pain.  Developed hematemesis and melena within the past 2 days.  Complains of epigastric pain.    Patient with syncopal episode went up to the bedside commode since admission.  Heart rate has been in the 100-110 range, blood pressure has been stable.  White blood cell count improved since admission.  Remains with mild hypokalemia.  Plan: 1. N.p.o. 2. Agree with Protonix drip. 3. Plan for EGD with propofol.  I have discussed the risks, alternatives, benefits with regards to but not limited to the risk of reaction to medication, bleeding, infection, perforation and the patient is agreeable to proceed. Written consent to be obtained. 4. Replace electrolytes per attending.   We would like to thank you for the opportunity to participate in the care of Adam Davenport.  There will be no GI coverage starting 07/31/17 at 1700 through 08/03/17 at 0730.   Leanna Battles. Dixon Boos [facility] [phone] 4/4/20198:42 AM     LOS: 0 days

## 2017-07-30 NOTE — Progress Notes (Signed)
*  PRELIMINARY RESULTS* Echocardiogram 2D Echocardiogram has been performed.  Stacey DrainWhite, Chigozie Basaldua J 07/30/2017, 1:47 PM

## 2017-07-30 NOTE — Transfer of Care (Signed)
Immediate Anesthesia Transfer of Care Note  Patient: Adam Davenport  Procedure(s) Performed: ESOPHAGOGASTRODUODENOSCOPY (EGD) WITH PROPOFOL (N/A )  Patient Location: PACU  Anesthesia Type:MAC  Level of Consciousness: awake and patient cooperative  Airway & Oxygen Therapy: Patient Spontanous Breathing and Patient connected to nasal cannula oxygen  Post-op Assessment: Report given to RN and Post -op Vital signs reviewed and stable  Post vital signs: Reviewed and stable  Last Vitals:  Vitals Value Taken Time  BP    Temp    Pulse    Resp    SpO2      Last Pain:  Vitals:   07/30/17 1424  TempSrc:   PainSc: 8       Patients Stated Pain Goal: 2 (92/33/00 7622)  Complications: No apparent anesthesia complications

## 2017-07-30 NOTE — Op Note (Signed)
[facility] Patient Name: Adam Davenport Procedure Date: 07/30/2017 1:36 PM MRN: [id number] Date of Birth: [birth year] Attending MD: Gennette Pac , MD CSN: [id number] Age: 37 Admit Type: Outpatient Procedure:                Upper GI endoscopy Indications:              Hematemesis Providers:                Gennette Pac, MD, Buel Ream. Thomasena Edis RN, RN,                            Dyann Ruddle Referring MD:             Kerry Hough Medicines:                Propofol per Anesthesia Complications:            No immediate complications. Estimated Blood Loss:     Estimated blood loss was minimal. Procedure:                Pre-Anesthesia Assessment:                           - Prior to the procedure, a History and Physical                            was performed, and patient medications and                            allergies were reviewed. The patient's tolerance of                            previous anesthesia was also reviewed. The risks                            and benefits of the procedure and the sedation                            options and risks were discussed with the patient.                            All questions were answered, and informed consent                            was obtained. Prior Anticoagulants: The patient has                            taken no previous anticoagulant or antiplatelet                            agents. ASA Grade Assessment: II - A patient with                            mild systemic disease. After reviewing the risks  and benefits, the patient was deemed in                            satisfactory condition to undergo the procedure.                           After obtaining informed consent, the endoscope was                            passed under direct vision. Throughout the                            procedure, the patient's blood pressure, pulse, and                            oxygen saturations were  monitored continuously. The                            [id number]) scope was introduced through the                            and advanced to the second part of duodenum. The                            upper GI endoscopy was accomplished without                            difficulty. The patient tolerated the procedure                            well. Scope In: 2:28:39 PM Scope Out: 2:38:11 PM Total Procedure Duration: 0 hours 9 minutes 32 seconds  Findings:      Esophagitis was found. Distal esophageal erosions within 5 mm the GE       junction.      Four non-bleeding cratered gastric ulcers with no stigmata of bleeding       were found in the gastric antrum. linear in shape. Longest approximately       8 mm in dimensions. Biopsies were taken with a cold forceps for       histology. Estimated blood loss was minimal.      numerous erosions extening from the junctiion of D1 int D3; posterior to       the bulb there was circumferential ulceration with denuding of the       duodenal mucosa. Please see photos. Biopsies for histology were taken       with a cold forceps uodenum and gastric ulceration. Estimated blood loss       was minimal. Impression:               - Mild erosive reflux esophagitis, .                           - Non-bleeding gastric ulcers with no stigmata of                            bleeding. Biopsied.                           -  Severe inflammatory changes including erosion and                            extensive ulceration of the duodenum as described.                            Biopsied. Findings in the duodenum and stomach most                            likely secondary to NSAID insult. Moderate Sedation:      Moderate (conscious) sedation was personally administered by an       anesthesia professional. The following parameters were monitored: oxygen       saturation, heart rate, blood pressure, respiratory rate, EKG, adequacy       of pulmonary  ventilation, and response to care. Total physician       intraservice time was 14 minutes. Recommendation:           - Return patient to hospital ward for ongoing care.                           - Clear liquid diet. Moving forward, complete                            abstinence from NSAID agents highly recommended.                           - Continue present medications. Discontinue PPI                            drip; Begin Protonix 40 mg IV every 12 hours. Add                            Carafate suspension QID. Zofran 4 mg IV q 6 hours                            -4 doss scheduled                           - Repeat upper endoscopy in 3 months for                            surveillance.                           - Return to GI clinic (date not yet determined). Procedure Code(s):        --- Professional ---                           [phone], Esophagogastroduodenoscopy, flexible,                            transoral; with biopsy, single or multiple Diagnosis Code(s):        --- Professional ---  K20.9, Esophagitis, unspecified                           K25.9, Gastric ulcer, unspecified as acute or                            chronic, without hemorrhage or perforation                           K26.9, Duodenal ulcer, unspecified as acute or                            chronic, without hemorrhage or perforation                           K92.0, Hematemesis CPT copyright 2017 American [employer] Association. All rights reserved. The codes documented in this report are preliminary and upon coder review may  be revised to meet current compliance requirements. Gerrit Friendsobert M. Ellinor Test, MD Gennette Pacobert Michael Masyn Rostro, MD 07/30/2017 2:54:43 PM This report has been signed electronically. Number of Addenda: 0

## 2017-07-30 NOTE — Anesthesia Procedure Notes (Signed)
Procedure Name: MAC Date/Time: 07/30/2017 2:19 PM Performed by: Vista Deck, CRNA Pre-anesthesia Checklist: Patient identified, Emergency Drugs available, Suction available, Timeout performed and Patient being monitored Patient Re-evaluated:Patient Re-evaluated prior to induction Oxygen Delivery Method: Non-rebreather mask

## 2017-07-30 NOTE — Addendum Note (Signed)
Addendum  created 07/30/17 1512 by Franco NonesYates, Omair Dettmer S, CRNA   Intraprocedure Event edited

## 2017-07-30 NOTE — Anesthesia Preprocedure Evaluation (Signed)
Anesthesia Evaluation  Patient identified by MRN, date of birth, ID band Patient awake    Reviewed: Allergy & Precautions, NPO status , Patient's Chart, lab work & pertinent test results  Airway Mallampati: II  TM Distance: >3 FB Neck ROM: Full    Dental no notable dental hx.    Pulmonary    Pulmonary exam normal breath sounds clear to auscultation       Cardiovascular Normal cardiovascular exam Rhythm:Regular Rate:Normal     Neuro/Psych Seizures -,  Anxiety Depression    GI/Hepatic (+)     substance abuse  alcohol use and cocaine use, Po narcotic use Upper GI bleed Hematochezia Syncope, vasovagal Cocaine abuse (HCC) Opioid abuse (HCC) Alcohol dependence (HCC) Melena NSAID overdose Acute GI bleeding Acute blood loss anemia     Endo/Other    Renal/GU      Musculoskeletal   Abdominal   Peds  Hematology  (+) anemia ,   Anesthesia Other Findings   Reproductive/Obstetrics                             Anesthesia Physical Anesthesia Plan  ASA: II  Anesthesia Plan: MAC   Post-op Pain Management:    Induction: Intravenous  PONV Risk Score and Plan:   Airway Management Planned: Nasal Cannula  Additional Equipment:   Intra-op Plan:   Post-operative Plan:   Informed Consent: I have reviewed the patients History and Physical, chart, labs and discussed the procedure including the risks, benefits and alternatives for the proposed anesthesia with the patient or authorized representative who has indicated his/her understanding and acceptance.     Plan Discussed with: CRNA  Anesthesia Plan Comments:         Anesthesia Quick Evaluation

## 2017-07-30 NOTE — Progress Notes (Signed)
Called by RN that patient had syncopal episode while having bowel moment. He has bloody BM. Patient admits to having exerted while having BM. Denies hitting head, hemoglobin is stable at 11.6. Likely vasovagal Will continue to monitor on telemetry.

## 2017-07-31 ENCOUNTER — Telehealth: Payer: Self-pay | Admitting: Gastroenterology

## 2017-07-31 DIAGNOSIS — K279 Peptic ulcer, site unspecified, unspecified as acute or chronic, without hemorrhage or perforation: Secondary | ICD-10-CM

## 2017-07-31 DIAGNOSIS — K2970 Gastritis, unspecified, without bleeding: Secondary | ICD-10-CM | POA: Diagnosis present

## 2017-07-31 DIAGNOSIS — F988 Other specified behavioral and emotional disorders with onset usually occurring in childhood and adolescence: Secondary | ICD-10-CM | POA: Diagnosis present

## 2017-07-31 DIAGNOSIS — K259 Gastric ulcer, unspecified as acute or chronic, without hemorrhage or perforation: Secondary | ICD-10-CM | POA: Diagnosis present

## 2017-07-31 DIAGNOSIS — F1110 Opioid abuse, uncomplicated: Secondary | ICD-10-CM | POA: Diagnosis present

## 2017-07-31 DIAGNOSIS — K922 Gastrointestinal hemorrhage, unspecified: Secondary | ICD-10-CM | POA: Diagnosis present

## 2017-07-31 DIAGNOSIS — F558 Abuse of other non-psychoactive substances: Secondary | ICD-10-CM | POA: Diagnosis present

## 2017-07-31 DIAGNOSIS — F419 Anxiety disorder, unspecified: Secondary | ICD-10-CM | POA: Diagnosis present

## 2017-07-31 DIAGNOSIS — K2210 Ulcer of esophagus without bleeding: Secondary | ICD-10-CM | POA: Diagnosis present

## 2017-07-31 DIAGNOSIS — E876 Hypokalemia: Secondary | ICD-10-CM | POA: Diagnosis present

## 2017-07-31 DIAGNOSIS — K2980 Duodenitis without bleeding: Secondary | ICD-10-CM | POA: Diagnosis present

## 2017-07-31 DIAGNOSIS — Z888 Allergy status to other drugs, medicaments and biological substances status: Secondary | ICD-10-CM | POA: Diagnosis not present

## 2017-07-31 DIAGNOSIS — F10230 Alcohol dependence with withdrawal, uncomplicated: Secondary | ICD-10-CM | POA: Diagnosis not present

## 2017-07-31 DIAGNOSIS — F1410 Cocaine abuse, uncomplicated: Secondary | ICD-10-CM | POA: Diagnosis present

## 2017-07-31 DIAGNOSIS — F1910 Other psychoactive substance abuse, uncomplicated: Secondary | ICD-10-CM | POA: Diagnosis present

## 2017-07-31 DIAGNOSIS — K921 Melena: Secondary | ICD-10-CM | POA: Diagnosis present

## 2017-07-31 DIAGNOSIS — T39395A Adverse effect of other nonsteroidal anti-inflammatory drugs [NSAID], initial encounter: Secondary | ICD-10-CM | POA: Diagnosis not present

## 2017-07-31 DIAGNOSIS — R569 Unspecified convulsions: Secondary | ICD-10-CM | POA: Diagnosis present

## 2017-07-31 DIAGNOSIS — R55 Syncope and collapse: Secondary | ICD-10-CM | POA: Diagnosis present

## 2017-07-31 DIAGNOSIS — K269 Duodenal ulcer, unspecified as acute or chronic, without hemorrhage or perforation: Secondary | ICD-10-CM | POA: Diagnosis present

## 2017-07-31 DIAGNOSIS — K2960 Other gastritis without bleeding: Secondary | ICD-10-CM | POA: Diagnosis not present

## 2017-07-31 DIAGNOSIS — Z79899 Other long term (current) drug therapy: Secondary | ICD-10-CM | POA: Diagnosis not present

## 2017-07-31 DIAGNOSIS — D62 Acute posthemorrhagic anemia: Secondary | ICD-10-CM | POA: Diagnosis present

## 2017-07-31 DIAGNOSIS — F329 Major depressive disorder, single episode, unspecified: Secondary | ICD-10-CM | POA: Diagnosis present

## 2017-07-31 LAB — ABO/RH: ABO/RH(D): A POS

## 2017-07-31 LAB — BASIC METABOLIC PANEL
ANION GAP: 8 (ref 5–15)
BUN: 15 mg/dL (ref 6–20)
CALCIUM: 7.6 mg/dL — AB (ref 8.9–10.3)
CO2: 24 mmol/L (ref 22–32)
CREATININE: 1.03 mg/dL (ref 0.61–1.24)
Chloride: 104 mmol/L (ref 101–111)
GLUCOSE: 135 mg/dL — AB (ref 65–99)
Potassium: 3.3 mmol/L — ABNORMAL LOW (ref 3.5–5.1)
Sodium: 136 mmol/L (ref 135–145)

## 2017-07-31 LAB — CBC
HEMATOCRIT: 20.9 % — AB (ref 39.0–52.0)
Hemoglobin: 7.2 g/dL — ABNORMAL LOW (ref 13.0–17.0)
MCH: 30.5 pg (ref 26.0–34.0)
MCHC: 34.4 g/dL (ref 30.0–36.0)
MCV: 88.6 fL (ref 78.0–100.0)
PLATELETS: 278 10*3/uL (ref 150–400)
RBC: 2.36 MIL/uL — ABNORMAL LOW (ref 4.22–5.81)
RDW: 12.2 % (ref 11.5–15.5)
WBC: 12 10*3/uL — AB (ref 4.0–10.5)

## 2017-07-31 LAB — HEPATITIS B SURFACE ANTIGEN: Hepatitis B Surface Ag: NEGATIVE

## 2017-07-31 LAB — URINE CULTURE: Culture: NO GROWTH

## 2017-07-31 LAB — HEPATITIS C ANTIBODY: HCV Ab: <0.1 s/co ratio (ref 0.0–0.9)

## 2017-07-31 LAB — HIV ANTIBODY (ROUTINE TESTING W REFLEX): HIV SCREEN 4TH GENERATION: NONREACTIVE

## 2017-07-31 MED ORDER — PHENOL 1.4 % MT LIQD
1.0000 | OROMUCOSAL | Status: DC | PRN
  Administered 2017-08-01: 1 via OROMUCOSAL
  Filled 2017-07-31: qty 177

## 2017-07-31 MED ORDER — SODIUM CHLORIDE 0.9 % IV SOLN: Freq: Once | INTRAVENOUS | Status: DC

## 2017-07-31 MED ORDER — PROMETHAZINE HCL 25 MG/ML IJ SOLN
25.0000 mg | Freq: Three times a day (TID) | INTRAMUSCULAR | Status: DC | PRN
  Administered 2017-07-31 (×2): 25 mg via INTRAVENOUS
  Filled 2017-07-31 (×2): qty 1

## 2017-07-31 MED ORDER — POTASSIUM CHLORIDE CRYS ER 20 MEQ PO TBCR: 40.0000 meq | EXTENDED_RELEASE_TABLET | Freq: Once | ORAL | Status: DC

## 2017-07-31 NOTE — Progress Notes (Signed)
PROGRESS NOTE    Adam Davenport  ONG:[id number] DOB: 1981/03/26 DOA: 07/29/2017 PCP: System, Pcp Not In   Brief Narrative:  37 year old male with a history of opiate addiction, depression/anxiety and polysubstance abuse, presented to the hospital with 1 day history of nausea and vomiting as well as hematochezia and syncope.  Patient had taken a large amount of NSAIDs due to worsening back pain.  He was admitted to the hospital for upper GI bleed likely induced by NSAIDs.  He underwent EGD that confirmed NSAID induced injury.  Hemoglobin is being monitored.  He is on PPI as well as Carafate.  Electrolytes are being corrected.  No signs of alcohol withdrawal at this time.   Assessment & Plan:   Active Problems:   Upper GI bleed   Hematochezia   Syncope, vasovagal   Cocaine abuse (HCC)   Opioid abuse (HCC)   Alcohol dependence (HCC)   Acute GI bleeding   Acute blood loss anemia   PUD (peptic ulcer disease)   Acute upper GI bleed   1. Upper GI bleeding.  Related to NSAID use.  Initially started on Protonix drip.  Seen by GI and underwent endoscopy with results below.  Protonix has been changed to twice daily.  Carafate has also been added.  He will need repeat endoscopy in 3 months for surveillance.  Currently on full liquid diet.  Advance as tolerated, as nausea improves.  He will need to avoid all further NSAIDs.  2. Acute blood loss anemia.  Hemoglobin has trended down from 13.9 on admission to 7.2 this morning.  He likely had an element of hemoconcentration on admission.  He will be transfused 2 units of PRBCs.  Continue to monitor serial hemoglobins. 3. Epigastric pain/chest pain.  Related to NSAID-induced gastritis/duodenitis.  Lipase normal. 4. Syncope.  Related to orthostasis/vagal reaction.  Echocardiogram is unremarkable.  Continue to monitor. 5. Leukocytosis.  Suspect stress demargination.  No evidence of infection at this time.  Improving.  Continue to monitor. 6. Hypokalemia.   Replace.  Magnesium replaced as well. 7. Polysubstance abuse.  He is on CIWA protocol for signs of alcohol withdrawal.  He was also found to be cocaine positive and has been using opiates off the street.  He was previously following with a Suboxone clinic.  He was seen by social work and offered community resources for drug rehab.  Continue to follow CIWA scores.   DVT prophylaxis: SCDs Code Status: Full code Family Communication: Discussed with mother at the bedside Disposition Plan: Discharge home once hemoglobin has stabilized.   Consultants:   Gastroenterology  Procedures:  EGD:  - Mild erosive reflux esophagitis, .                           - Non-bleeding gastric ulcers with no stigmata of                            bleeding. Biopsied.                           - Severe inflammatory changes including erosion and                            extensive ulceration of the duodenum as described.  Biopsied. Findings in the duodenum and stomach most                            likely secondary to NSAID insult     Antimicrobials:      Subjective: Continues to have dark stool.  No fresh blood in stool.  He is having nausea but no vomiting..  Objective: Vitals:   07/31/17 1440 07/31/17 1707 07/31/17 1837 07/31/17 1900  BP: 112/70 119/65 119/70 117/78  Pulse: 90 84 84 94  Resp:  20 18 18   Temp: 99.7 F (37.6 C) 99.5 F (37.5 C) 99.6 F (37.6 C) 98.7 F (37.1 C)  TempSrc: Oral Oral Oral Oral  SpO2: 100% 100% 99% 100%  Weight:      Height:        Intake/Output Summary (Last 24 hours) at 07/31/2017 1910 Last data filed at 07/31/2017 1837 Gross per 24 hour  Intake 1925.42 ml  Output 900 ml  Net 1025.42 ml   Filed Weights   07/29/17 1543  Weight: 77.1 kg (170 lb)    Examination:  General exam: Alert, awake, oriented x 3 Respiratory system: Clear to auscultation. Respiratory effort normal. Cardiovascular system:RRR. No murmurs, rubs,  gallops. Gastrointestinal system: Abdomen is nondistended, soft and tender in epigastrium. No organomegaly or masses felt. Normal bowel sounds heard. Central nervous system: Alert and oriented. No focal neurological deficits. Extremities: No C/C/E, +pedal pulses Skin: No rashes, lesions or ulcers Psychiatry: Appears mildly anxious.  Cooperative exam     Data Reviewed: I have personally reviewed following labs and imaging studies  CBC: Recent Labs  Lab 07/29/17 1635 07/29/17 2241 07/30/17 0457 07/30/17 1856 07/31/17 0557  WBC 21.8* 16.3* 15.5* 13.8* 12.0*  NEUTROABS 18.1*  --   --   --   --   HGB 13.9 11.6* 9.3* 7.7* 7.2*  HCT 40.8 34.1* 27.1* 22.0* 20.9*  MCV 88.7 89.5 88.3 88.4 88.6  PLT 377 184 305 272 278   Basic Metabolic Panel: Recent Labs  Lab 07/29/17 1635 07/30/17 0457 07/31/17 0557  NA 140 137 136  K 3.1* 3.2* 3.3*  CL 106 107 104  CO2 22 20* 24  GLUCOSE 147* 141* 135*  BUN 35* 33* 15  CREATININE 1.16 0.94 1.03  CALCIUM 8.7* 7.7* 7.6*  MG  --  1.5*  --    GFR: Estimated Creatinine Clearance: 95 mL/min (by C-G formula based on SCr of 1.03 mg/dL). Liver Function Tests: Recent Labs  Lab 07/29/17 1635  AST 36  ALT 46  ALKPHOS 60  BILITOT 0.7  PROT 6.7  ALBUMIN 4.1   Recent Labs  Lab 07/29/17 1635  LIPASE 32   No results for input(s): AMMONIA in the last 168 hours. Coagulation Profile: Recent Labs  Lab 07/29/17 2241  INR 1.17   Cardiac Enzymes: No results for input(s): CKTOTAL, CKMB, CKMBINDEX, TROPONINI in the last 168 hours. BNP (last 3 results) No results for input(s): PROBNP in the last 8760 hours. HbA1C: Recent Labs    07/29/17 2241  HGBA1C 5.5   CBG: No results for input(s): GLUCAP in the last 168 hours. Lipid Profile: No results for input(s): CHOL, HDL, LDLCALC, TRIG, CHOLHDL, LDLDIRECT in the last 72 hours. Thyroid Function Tests: No results for input(s): TSH, T4TOTAL, FREET4, T3FREE, THYROIDAB in the last 72  hours. Anemia Panel: No results for input(s): VITAMINB12, FOLATE, FERRITIN, TIBC, IRON, RETICCTPCT in the last 72 hours. Sepsis Labs: Recent Labs  Lab  07/29/17 1706 07/30/17 0457  LATICACIDVEN 2.91* 2.0*    Recent Results (from the past 240 hour(s))  Culture, Urine     Status: None   Collection Time: 07/29/17  9:46 PM  Result Value Ref Range Status   Specimen Description   Final    URINE, CLEAN CATCH Performed at [facility], [address] [place], [place] [address]    Special Requests   Final    NONE Performed at [facility], [address] [place], [place] [address]    Culture   Final    NO GROWTH Performed at [facility], [address] [place], [place] [address]    Report Status 07/31/2017 FINAL  Final         Radiology Studies: Dg Chest 2 View  Result Date: 07/29/2017 CLINICAL DATA:  Nausea and vomiting EXAM: CHEST - 2 VIEW COMPARISON:  12/18/2016 FINDINGS: The heart size and mediastinal contours are within normal limits. Both lungs are clear. The visualized skeletal structures are unremarkable. IMPRESSION: No active cardiopulmonary disease. Electronically Signed   By: Deatra RobinsonKevin  Herman M.D.   On: 07/29/2017 22:47        Scheduled Meds: . folic acid  1 mg Oral Daily  . gabapentin  600 mg Oral TID  . lidocaine  1 patch Transdermal Q24H  . lisdexamfetamine  50 mg Oral Daily  . multivitamin with minerals  1 tablet Oral Daily  . pantoprazole (PROTONIX) IV  40 mg Intravenous Q12H  . potassium chloride  40 mEq Oral Once  . sucralfate  1 g Oral TID WC & HS  . thiamine  100 mg Oral Daily   Or  . thiamine  100 mg Intravenous Daily   Continuous Infusions: . sodium chloride    . sodium chloride       LOS: 0 days    Erick BlinksJehanzeb [name], MD [facility] Pager [phone]  If 7PM-7AM, please contact night-coverage [email or web address] Password [email or web address] 07/31/2017, 7:10 PM

## 2017-07-31 NOTE — Progress Notes (Signed)
Telemetry monitor discontinued d/t expiring this shift. When NT Drequan removed monitor pt asked for RN for Ativan. When Rn went in room pt was sleeping-RN asked pt if he needed something without pt even waking up.  This is the second time this has happened this shift. Earlier in the shift pt asked for Ativan and told this nurse that she previous nurse did not give it to him. This RN adv pt that Ativan was charted for 1945 and he could not get another dose until 0130 in the morning if it was needed. When RN went to see if pt still wanted Ativan @ 0130-pt was sleeping. Will continue to monitor pt

## 2017-07-31 NOTE — Progress Notes (Signed)
Subjective: Feels weak. Epigastric pain noted. No hematemesis. Last melena yesterday evening. Nauseated. Zofran not helping. Asking when he can go home.  Objective: Vital signs in last 24 hours: Temp:  [97.8 F (36.6 C)-99.7 F (37.6 C)] 99.1 F (37.3 C) (04/05 0617) Pulse Rate:  [81-101] 87 (04/05 0617) Resp:  [12-22] 12 (04/05 0617) BP: (105-130)/(53-104) 127/75 (04/05 0617) SpO2:  [96 %-100 %] 99 % (04/05 0617) Last BM Date: 07/30/17 General:   Alert and oriented, flat affect  Head:  Normocephalic and atraumatic. Abdomen:  Bowel sounds present, soft, TTP epigastric, no rebound or guarding, no HSM Neurologic:   oriented x4   Intake/Output from previous day: 04/04 0701 - 04/05 0700 In: 1295.4 [P.O.:410; I.V.:885.4] Out: 900 [Urine:900] Intake/Output this shift: No intake/output data recorded.  Lab Results: Recent Labs    07/30/17 0457 07/30/17 1856 07/31/17 0557  WBC 15.5* 13.8* 12.0*  HGB 9.3* 7.7* 7.2*  HCT 27.1* 22.0* 20.9*  PLT 305 272 278   BMET Recent Labs    07/29/17 1635 07/30/17 0457 07/31/17 0557  NA 140 137 136  K 3.1* 3.2* 3.3*  CL 106 107 104  CO2 22 20* 24  GLUCOSE 147* 141* 135*  BUN 35* 33* 15  CREATININE 1.16 0.94 1.03  CALCIUM 8.7* 7.7* 7.6*   LFT Recent Labs    07/29/17 1635  PROT 6.7  ALBUMIN 4.1  AST 36  ALT 46  ALKPHOS 60  BILITOT 0.7   PT/INR Recent Labs    07/29/17 2241  LABPROT 14.9  INR 1.17   Hepatitis Panel Recent Labs    07/30/17 0457  HEPBSAG Negative  HCVAB <0.1     Studies/Results: Dg Chest 2 View  Result Date: 07/29/2017 CLINICAL DATA:  Nausea and vomiting EXAM: CHEST - 2 VIEW COMPARISON:  12/18/2016 FINDINGS: The heart size and mediastinal contours are within normal limits. Both lungs are clear. The visualized skeletal structures are unremarkable. IMPRESSION: No active cardiopulmonary disease. Electronically Signed   By: Deatra RobinsonKevin  Herman M.D.   On: 07/29/2017 22:47   Ct Head Wo Contrast  Result  Date: 07/29/2017 CLINICAL DATA:  Loss of consciousness following bowel movement with fall and maxillofacial trauma. Emesis. Initial encounter. EXAM: CT HEAD WITHOUT CONTRAST TECHNIQUE: Contiguous axial images were obtained from the base of the skull through the vertex without intravenous contrast. COMPARISON:  12/18/2016 FINDINGS: Brain: There is no evidence of acute infarct, intracranial hemorrhage, mass, midline shift, or extra-axial fluid collection. The ventricles and sulci are normal. Vascular: No hyperdense vessel. Skull: No fracture or suspicious osseous lesion. Sinuses/Orbits: Visualized paranasal sinuses and mastoid air cells are clear. Orbits are unremarkable. Other: None. IMPRESSION: Negative head CT. Electronically Signed   By: Sebastian AcheAllen  Grady M.D.   On: 07/29/2017 17:48    Assessment: 37 year old male presenting with hemodynamically significant acute upper GI bleed in setting of NSAID use. EGD 4/4 with mild erosive reflux esophagitis, non-bleeding gastric ulcers, severe inflammatory changes and erosions/extensive ulcerations of duodenum s/p biopsy felt to be related to NSAIDs.  Acute blood loss anemia: on admission was 13, down to 7.7 yesterday evening and 7.2 this morning. Leukocytosis improved from admission. Feeling weak this morning. Discussed with Dr. Kerry HoughMemon, who will order blood transfusion. Last evidence of melena yesterday evening. Continue to follow H/H  PUD: Protonix infusion discontinued. Now on PPI IV BID. Associated nausea. Will add phenergan prn, as Zofran is offering little relief.    Plan: PPI IV BID: transition to oral when diet advanced. Will  need BID for 3 months then daily thereafter. Clear liquid diet: may advance as tolerated to soft diet once nausea improves Carafate suspension QID Phenergan added prn nausea Follow H/H Transfusion per hospitalist Repeat EGD in 3 months for surveillance Completely avoid NSAIDs Will arrange outpatient follow-up with [facility] to arrange  EGD.    No GI coverage starting at 5pm today through weekend. Anticipate he will likely be discharged in next 24 hours if remains clinically stable. Outpatient appointment to be arranged with our practice.    Gelene Mink, PhD, ANP-BC [facility]     LOS: 0 days    07/31/2017, 8:05 AM

## 2017-07-31 NOTE — Clinical Social Work Note (Signed)
Patient indicates that he is aware of SA treatment resources and how to engage them. He indicated that [facility] has [facility]. LCSW provided patient with resources for [facility] and contact information for their treatment facilities.  Patient stated that he would review the literature.   LCSW signing off.

## 2017-07-31 NOTE — Telephone Encounter (Signed)
Patient likely to be discharged over weekend. Please arrange hospital follow-up with Verlon AuLeslie or myself in next 6-8 weeks. We will arrange EGD at that time (PUD). Thanks!

## 2017-07-31 NOTE — Progress Notes (Signed)
Late entry from 2300-Pt refused all PO meds d/t complaints of anxiety. Pt stated that he threw up earlier when he took his medications by mouth and didn't want to do that again. Rn educated pt on all medications due and importance of them.  Pt still refused. Pts blood consent signed, adv of hgb of 7.7

## 2017-08-01 LAB — TYPE AND SCREEN
ABO/RH(D): A POS
ANTIBODY SCREEN: NEGATIVE
UNIT DIVISION: 0
Unit division: 0

## 2017-08-01 LAB — BPAM RBC
BLOOD PRODUCT EXPIRATION DATE: 201904282359
Blood Product Expiration Date: 201904282359
ISSUE DATE / TIME: 201904051831
ISSUE DATE / TIME: 201904051410
UNIT TYPE AND RH: 6200
Unit Type and Rh: 6200

## 2017-08-01 LAB — CBC
HCT: 25.4 % — ABNORMAL LOW (ref 39.0–52.0)
Hemoglobin: 8.7 g/dL — ABNORMAL LOW (ref 13.0–17.0)
MCH: 29.6 pg (ref 26.0–34.0)
MCHC: 34.3 g/dL (ref 30.0–36.0)
MCV: 86.4 fL (ref 78.0–100.0)
PLATELETS: 244 10*3/uL (ref 150–400)
RBC: 2.94 MIL/uL — ABNORMAL LOW (ref 4.22–5.81)
RDW: 13.2 % (ref 11.5–15.5)
WBC: 9.8 10*3/uL (ref 4.0–10.5)

## 2017-08-01 LAB — BASIC METABOLIC PANEL
Anion gap: 9 (ref 5–15)
BUN: 11 mg/dL (ref 6–20)
CO2: 26 mmol/L (ref 22–32)
CREATININE: 0.92 mg/dL (ref 0.61–1.24)
Calcium: 8.1 mg/dL — ABNORMAL LOW (ref 8.9–10.3)
Chloride: 101 mmol/L (ref 101–111)
GFR calc Af Amer: >60 mL/min (ref >60)
Glucose, Bld: 116 mg/dL — ABNORMAL HIGH (ref 65–99)
Potassium: 3 mmol/L — ABNORMAL LOW (ref 3.5–5.1)
Sodium: 136 mmol/L (ref 135–145)

## 2017-08-01 LAB — MAGNESIUM: MAGNESIUM: 2.1 mg/dL (ref 1.7–2.4)

## 2017-08-01 MED ORDER — FOLIC ACID 1 MG PO TABS: 1.0000 mg | ORAL_TABLET | Freq: Every day | ORAL | Status: DC

## 2017-08-01 MED ORDER — POTASSIUM CHLORIDE IN NACL 20-0.9 MEQ/L-% IV SOLN
INTRAVENOUS | Status: DC
  Administered 2017-08-01 – 2017-08-02 (×3): via INTRAVENOUS

## 2017-08-01 MED ORDER — LORAZEPAM 1 MG PO TABS: 1.0000 mg | ORAL_TABLET | Freq: Four times a day (QID) | ORAL | Status: DC | PRN

## 2017-08-01 MED ORDER — MAGNESIUM SULFATE 2 GM/50ML IV SOLN
2.0000 g | Freq: Once | INTRAVENOUS | Status: AC
  Administered 2017-08-01: 2 g via INTRAVENOUS
  Filled 2017-08-01: qty 50

## 2017-08-01 MED ORDER — VITAMIN B-1 100 MG PO TABS: 100.0000 mg | ORAL_TABLET | Freq: Every day | ORAL | Status: DC

## 2017-08-01 MED ORDER — POTASSIUM CHLORIDE CRYS ER 20 MEQ PO TBCR
60.0000 meq | EXTENDED_RELEASE_TABLET | Freq: Once | ORAL | Status: AC
  Administered 2017-08-01: 60 meq via ORAL
  Filled 2017-08-01: qty 3

## 2017-08-01 MED ORDER — LORAZEPAM 1 MG PO TABS: 0.0000 mg | ORAL_TABLET | Freq: Two times a day (BID) | ORAL | Status: DC

## 2017-08-01 MED ORDER — THIAMINE HCL 100 MG/ML IJ SOLN: 100.0000 mg | Freq: Every day | INTRAMUSCULAR | Status: DC

## 2017-08-01 MED ORDER — POTASSIUM CHLORIDE CRYS ER 20 MEQ PO TBCR
40.0000 meq | EXTENDED_RELEASE_TABLET | Freq: Once | ORAL | Status: DC
  Filled 2017-08-01: qty 2

## 2017-08-01 MED ORDER — LORAZEPAM 2 MG/ML IJ SOLN: 1.0000 mg | Freq: Four times a day (QID) | INTRAMUSCULAR | Status: DC | PRN

## 2017-08-01 MED ORDER — LORAZEPAM 1 MG PO TABS
0.0000 mg | ORAL_TABLET | Freq: Four times a day (QID) | ORAL | Status: DC
  Administered 2017-08-01 – 2017-08-02 (×3): 1 mg via ORAL
  Filled 2017-08-01 (×3): qty 1

## 2017-08-01 MED ORDER — ADULT MULTIVITAMIN W/MINERALS CH: 1.0000 | ORAL_TABLET | Freq: Every day | ORAL | Status: DC

## 2017-08-01 NOTE — Progress Notes (Signed)
PT Cancellation Note  Patient Details Name: Adam Davenport MRN: [id number] DOB: 09-16-1980   Cancelled Treatment:    Reason Eval/Treat Not Completed: PT screened, no needs identified, will sign off Pt independent with all functional tasks. Has no concerns regarding home access. Pt WNL with regards to mobility, no acute or f/u PT needs identified. Will sign off acutely on pt for now.     Jac CanavanBrooke Powell PT, DPT

## 2017-08-01 NOTE — Progress Notes (Signed)
Patient ambulated in hallway a few steps, complained of dizziness and returned to room

## 2017-08-01 NOTE — Progress Notes (Signed)
PROGRESS NOTE   Adam Davenport  ZOX:[id number] DOB: [birth year] DOA: 07/29/2017 PCP: System, Pcp Not In   Brief Narrative:  37 year old male with a history of opiate addiction, depression/anxiety and polysubstance abuse, presented to the hospital with 1 day history of nausea and vomiting as well as hematochezia and syncope.  Patient had taken a large amount of NSAIDs due to worsening back pain.  He was admitted to the hospital for upper GI bleed likely induced by NSAIDs.  He underwent EGD that confirmed NSAID induced injury.  Hemoglobin is being monitored.  He is on PPI as well as Carafate.  Electrolytes are being corrected.  No signs of alcohol withdrawal at this time.  Assessment & Plan:   Active Problems:   Upper GI bleed   Hematochezia   Syncope, vasovagal   Cocaine abuse (HCC)   Opioid abuse (HCC)   Alcohol dependence (HCC)   Acute GI bleeding   Acute blood loss anemia   PUD (peptic ulcer disease)   Acute upper GI bleed  1. Upper GI bleeding.  Related to NSAID use.  Initially started on Protonix drip.  Seen by GI and underwent endoscopy with results below.  Protonix has been changed to twice daily.  Carafate has also been added.  He will need repeat endoscopy in 3 months for surveillance.  Currently on full liquid diet.  Advance as tolerated, as nausea improves.  He will need to avoid all further NSAIDs. Soft diet started today.   2. Acute blood loss anemia.  Hg up to 8.7 after 2 unit PRBCs.  He likely had an element of hemoconcentration on admission.  Recheck in AM.   3. Epigastric pain/chest pain.  Related to NSAID-induced gastritis/duodenitis.  Lipase normal. 4. Syncope.  Related to orthostasis/vagal reaction.  Echocardiogram is unremarkable.  Continue to monitor. 5. Leukocytosis.  Resolved now.  Suspect stress demargination.  No evidence of infection at this time.  Improving.  Continue to monitor. 6. Hypokalemia. Pt refusing to take potassium tablets, added to IVFs,  Magnesium  replaced as well. 7. Polysubstance abuse.  He is on CIWA protocol for signs of alcohol withdrawal.  He was also found to be cocaine positive and has been using opiates off the street.  He was previously following with a Suboxone clinic.  He was seen by social work and offered community resources for drug rehab.  Continue to follow CIWA scores.  DVT prophylaxis: SCDs Code Status: Full code Family Communication: Discussed with mother at the bedside Disposition Plan: Discharge home hopefully tomorrow   Consultants:   Gastroenterology  Procedures:  EGD:  - Mild erosive reflux esophagitis, .                           - Non-bleeding gastric ulcers with no stigmata of bleeding. Biopsied.                           - Severe inflammatory changes including erosion and extensive ulceration of the duodenum as described. Biopsied. Findings in the duodenum and stomach most likely secondary to NSAID insult   Subjective: Pt without complaints, really wants to discharge, no black stools reported.   Objective: Vitals:   07/31/17 1900 07/31/17 1959 07/31/17 2144 08/01/17 0442  BP: 117/78 113/61 123/79 129/77  Pulse: 94 82 79 75  Resp: 18 18    Temp: 98.7 F (37.1 C) 98.9 F (37.2 C) 98.8 F (37.1  C) 99.4 F (37.4 C)  TempSrc: Oral Oral Oral Oral  SpO2: 100% 100% 98% 100%  Weight:    77.3 kg (170 lb 6.7 oz)  Height:        Intake/Output Summary (Last 24 hours) at 08/01/2017 1146 Last data filed at 07/31/2017 2144 Gross per 24 hour  Intake 1209.23 ml  Output -  Net 1209.23 ml   Filed Weights   07/29/17 1543 08/01/17 0442  Weight: 77.1 kg (170 lb) 77.3 kg (170 lb 6.7 oz)    Examination:  General exam: Alert, awake, oriented x 3 Respiratory system: Clear to auscultation. Respiratory effort normal. Cardiovascular system:RRR. No murmurs, rubs, gallops. Gastrointestinal system: Abdomen is nondistended, soft and tender in epigastrium. No organomegaly or masses felt. Normal bowel sounds  heard. Central nervous system: Alert and oriented. No focal neurological deficits. Extremities: No C/C/E, +pedal pulses Skin: No rashes, lesions or ulcers Psychiatry: flat affect.  Cooperative exam  Data Reviewed: I have personally reviewed following labs and imaging studies  CBC: Recent Labs  Lab 07/29/17 1635 07/29/17 2241 07/30/17 0457 07/30/17 1856 07/31/17 0557 08/01/17 0511  WBC 21.8* 16.3* 15.5* 13.8* 12.0* 9.8  NEUTROABS 18.1*  --   --   --   --   --   HGB 13.9 11.6* 9.3* 7.7* 7.2* 8.7*  HCT 40.8 34.1* 27.1* 22.0* 20.9* 25.4*  MCV 88.7 89.5 88.3 88.4 88.6 86.4  PLT 377 184 305 272 278 244   Basic Metabolic Panel: Recent Labs  Lab 07/29/17 1635 07/30/17 0457 07/31/17 0557 08/01/17 0511 08/01/17 0643  NA 140 137 136 136  --   K 3.1* 3.2* 3.3* 3.0*  --   CL 106 107 104 101  --   CO2 22 20* 24 26  --   GLUCOSE 147* 141* 135* 116*  --   BUN 35* 33* 15 11  --   CREATININE 1.16 0.94 1.03 0.92  --   CALCIUM 8.7* 7.7* 7.6* 8.1*  --   MG  --  1.5*  --   --  2.1   GFR: Estimated Creatinine Clearance: 106.4 mL/min (by C-G formula based on SCr of 0.92 mg/dL). Liver Function Tests: Recent Labs  Lab 07/29/17 1635  AST 36  ALT 46  ALKPHOS 60  BILITOT 0.7  PROT 6.7  ALBUMIN 4.1   Recent Labs  Lab 07/29/17 1635  LIPASE 32   No results for input(s): AMMONIA in the last 168 hours. Coagulation Profile: Recent Labs  Lab 07/29/17 2241  INR 1.17   Cardiac Enzymes: No results for input(s): CKTOTAL, CKMB, CKMBINDEX, TROPONINI in the last 168 hours. BNP (last 3 results) No results for input(s): PROBNP in the last 8760 hours. HbA1C: Recent Labs    07/29/17 2241  HGBA1C 5.5   CBG: No results for input(s): GLUCAP in the last 168 hours. Lipid Profile: No results for input(s): CHOL, HDL, LDLCALC, TRIG, CHOLHDL, LDLDIRECT in the last 72 hours. Thyroid Function Tests: No results for input(s): TSH, T4TOTAL, FREET4, T3FREE, THYROIDAB in the last 72 hours. Anemia  Panel: No results for input(s): VITAMINB12, FOLATE, FERRITIN, TIBC, IRON, RETICCTPCT in the last 72 hours. Sepsis Labs: Recent Labs  Lab 07/29/17 1706 07/30/17 0457  LATICACIDVEN 2.91* 2.0*    Recent Results (from the past 240 hour(s))  Culture, Urine     Status: None   Collection Time: 07/29/17  9:46 PM  Result Value Ref Range Status   Specimen Description   Final    URINE, CLEAN CATCH Performed at  [facility], [address], [place], [place] [address]    Special Requests   Final    NONE Performed at [facility], [address], [place], [place] [address]    Culture   Final    NO GROWTH Performed at [facility], [address], [place], [place] [address]    Report Status 07/31/2017 FINAL  Final    Radiology Studies:  No results found. Scheduled Meds: . folic acid  1 mg Oral Daily  . gabapentin  600 mg Oral TID  . lidocaine  1 patch Transdermal Q24H  . lisdexamfetamine  50 mg Oral Daily  . multivitamin with minerals  1 tablet Oral Daily  . pantoprazole (PROTONIX) IV  40 mg Intravenous Q12H  . potassium chloride  40 mEq Oral Once  . sucralfate  1 g Oral TID WC & HS  . thiamine  100 mg Oral Daily   Or  . thiamine  100 mg Intravenous Daily   Continuous Infusions: . sodium chloride    . 0.9 % NaCl with KCl 20 mEq / L 125 mL/hr at 08/01/17 1129  . sodium chloride       LOS: 1 day    Standley Dakins, MD  [facility] Pager [phone]  If 7PM-7AM, please contact night-coverage [email or web address] Password [email or web address] 08/01/2017, 11:46 AM

## 2017-08-02 DIAGNOSIS — K2960 Other gastritis without bleeding: Secondary | ICD-10-CM

## 2017-08-02 DIAGNOSIS — T39395A Adverse effect of other nonsteroidal anti-inflammatory drugs [NSAID], initial encounter: Secondary | ICD-10-CM

## 2017-08-02 LAB — CBC
HEMATOCRIT: 24.7 % — AB (ref 39.0–52.0)
HEMOGLOBIN: 8.4 g/dL — AB (ref 13.0–17.0)
MCH: 30.1 pg (ref 26.0–34.0)
MCHC: 34 g/dL (ref 30.0–36.0)
MCV: 88.5 fL (ref 78.0–100.0)
Platelets: 307 10*3/uL (ref 150–400)
RBC: 2.79 MIL/uL — AB (ref 4.22–5.81)
RDW: 13.2 % (ref 11.5–15.5)
WBC: 8 10*3/uL (ref 4.0–10.5)

## 2017-08-02 LAB — BASIC METABOLIC PANEL
Anion gap: 7 (ref 5–15)
BUN: 9 mg/dL (ref 6–20)
CHLORIDE: 106 mmol/L (ref 101–111)
CO2: 26 mmol/L (ref 22–32)
CREATININE: 0.99 mg/dL (ref 0.61–1.24)
Calcium: 7.9 mg/dL — ABNORMAL LOW (ref 8.9–10.3)
GFR calc Af Amer: >60 mL/min (ref >60)
GFR calc non Af Amer: >60 mL/min (ref >60)
Glucose, Bld: 125 mg/dL — ABNORMAL HIGH (ref 65–99)
POTASSIUM: 3.2 mmol/L — AB (ref 3.5–5.1)
SODIUM: 139 mmol/L (ref 135–145)

## 2017-08-02 MED ORDER — THIAMINE HCL 100 MG PO TABS: 100.0000 mg | ORAL_TABLET | Freq: Every day | ORAL | 0 refills | Status: AC

## 2017-08-02 MED ORDER — POTASSIUM CHLORIDE CRYS ER 20 MEQ PO TBCR
60.0000 meq | EXTENDED_RELEASE_TABLET | Freq: Once | ORAL | Status: AC
  Administered 2017-08-02: 60 meq via ORAL
  Filled 2017-08-02: qty 3

## 2017-08-02 MED ORDER — SUCRALFATE 1 G PO TABS: 1.0000 g | ORAL_TABLET | Freq: Three times a day (TID) | ORAL | 0 refills | Status: AC

## 2017-08-02 MED ORDER — POTASSIUM CHLORIDE CRYS ER 20 MEQ PO TBCR: 20.0000 meq | EXTENDED_RELEASE_TABLET | Freq: Every day | ORAL | 0 refills | Status: AC

## 2017-08-02 MED ORDER — PANTOPRAZOLE SODIUM 40 MG PO TBEC: 40.0000 mg | DELAYED_RELEASE_TABLET | Freq: Two times a day (BID) | ORAL | 0 refills | Status: AC

## 2017-08-02 MED ORDER — ADULT MULTIVITAMIN W/MINERALS CH: 1.0000 | ORAL_TABLET | Freq: Every day | ORAL | Status: AC

## 2017-08-02 MED ORDER — FOLIC ACID 1 MG PO TABS: 1.0000 mg | ORAL_TABLET | Freq: Every day | ORAL | 0 refills | Status: AC

## 2017-08-02 NOTE — Progress Notes (Signed)
Patient ambulated in hallway with NT. Ambulated up to Nurses's station without any difficulty.

## 2017-08-02 NOTE — Discharge Summary (Signed)
Physician Discharge Summary  Adam Davenport ZOX:[id number] DOB: [birth year] DOA: 07/29/2017  PCP: System, Pcp Not In GI: Adam Loron NP   Admit date: 07/29/2017 Discharge date: 08/02/2017  Admitted From: Home  Disposition: Home   Recommendations for Outpatient Follow-up:  1. Follow up with PCP in 1 weeks 2. Follow up with GI in 8 weeks 3. Enroll in substance abuse treatment program  Discharge Condition: STABLE   CODE STATUS: FULL    Brief Hospitalization Summary: Please see all hospital notes, images, labs for full details of the hospitalization. HPI:  Adam Davenport is a 37 y.o. male with medical history of opioid addiction, depression/anxiety, and polysubstance abuse (cocaine, opioids, Etoh) presenting with 1 day history of nausea, vomiting, hematochezia, and syncope.  The patient had been on a buprenorphine and naloxone equivalent through [facility] until approximately 5-6 months ago when he was lost to follow-up.  Since that period of time, the patient had been getting opioids "off the street".  However in the past 3-4 days, he had complained of worsening back pain, and he was not able to get his opioids.  As a result, the patient began taking up to 40 tablets (200mg ) of ibuprofen in the last 48 hours for his pain.  On the evening of 1419, the patient began having epigastric pain, nausea, and vomiting.  He had coffee grounds emesis and occasionally some bloody.  In addition, the patient had been having melanotic stools and occasional hematochezia over the last 24-48 hours.  On the morning of 07/29/2017, after taking a hot shower, the patient had a syncopal episode getting out of the bathtub.  The patient's mother found the patient on the floor.  There was no loss of bowel or bladder incontinence.  There is no postictal state.  The patient was able to walk downstairs with some assistance from his father.  Because he continued to have hematochezia in the setting of syncopal episode, EMS  was activated.  Apparently, the patient hit his head against the side of the tub during his syncopal episode.  The patient also endorsed using cocaine approximately 3 days prior to this admission.  The patient also drinks 2-3 beers per week and a couple of mixed drinks.  He denies any tobacco or other illegal drug use.  He denied any fevers, headache, neck pain, dysuria, hematuria.  He had epigastric abdominal pain.  In the emergency department, the patient was afebrile hemodynamically stable.  He was initially tachycardic in the 130s.  After 2 L normal saline, the patient's heart rate improved to 100-110.  CBC showed WBC 21.8, hemoglobin 13.9, platelets 277.  AST 36, ALT 46, lipase 32.  BMP showed potassium 3.1 with serum creatinine 1.16 and BUN 35.  Urinalysis was negative for pyuria.  FOBT was positive.  CT of the brain was negative for any acute findings.  Brief Narrative:  37 year old male with a history of opiate addiction, depression/anxiety and polysubstance abuse, presented to the hospital with 1 day history of nausea and vomiting as well as hematochezia and syncope.  Patient had taken a large amount of NSAIDs due to worsening back pain.  He was admitted to the hospital for upper GI bleed likely induced by NSAIDs.  He underwent EGD that confirmed NSAID induced injury.  Hemoglobin is being monitored.  He is on PPI as well as Carafate.  Electrolytes are being corrected.  No signs of alcohol withdrawal at this time.  Assessment & Plan:   Active Problems:  Upper GI bleed   Hematochezia   Syncope, vasovagal   Cocaine abuse (HCC)   Opioid abuse (HCC)   Alcohol dependence (HCC)   Acute GI bleeding   Acute blood loss anemia   PUD (peptic ulcer disease)   Acute upper GI bleed  1. Upper GI bleeding.  Related to NSAID abuse.  Initially started on Protonix drip.  Seen by GI and underwent endoscopy with results below.  Protonix has been changed to twice daily.  Carafate has also been added.  He  will need repeat endoscopy in 3 months for surveillance.  Currently tolerating soft diet.  He will need to avoid all further NSAIDs. Follow up with GI in 8 weeks.   2. Acute blood loss anemia.  Hg up to 8.7 after 2 unit PRBCs.  He likely had an element of hemoconcentration on admission.  Follow up outpatient.    3. Epigastric pain/chest pain.  Related to NSAID-induced gastritis/duodenitis.  Lipase normal. 4. Syncope.  Related to orthostasis/vagal reaction.  Echocardiogram is unremarkable.   5. Leukocytosis.  Resolved now.  Suspect stress demargination.  No evidence of infection at this time.  Improving.  Continue to monitor. 6. Hypokalemia. Pt refusing to take potassium tablets, added to IVFs,  Magnesium replaced as well. Additional K prescribed at discharge. 7. Polysubstance abuse.  He was on CIWA protocol for signs of alcohol withdrawal.  He was also found to be cocaine positive and has been using opiates off the street.  He was previously following with a Suboxone clinic.  He was seen by social work and offered community resources for drug rehab.  He says he plans to seek outpatient treatment once discharged.    DVT prophylaxis: SCDs Code Status: Full code Family Communication: Disposition Plan: Home   Consultants:   Gastroenterology  Procedures:  EGD: - Mild erosive reflux esophagitis, . - Non-bleeding gastric ulcers with no stigmata of bleeding. Biopsied. - Severe inflammatory changes including erosion and extensive ulceration of the duodenum as described. Biopsied. Findings in the duodenum and stomach most likely secondary to NSAID insult   Discharge Diagnoses:  Active Problems:   Upper GI bleed   Hematochezia   Syncope, vasovagal   Cocaine abuse (HCC)   Opioid abuse (HCC)   Alcohol dependence (HCC)   Acute GI bleeding   Acute blood loss anemia   PUD (peptic ulcer disease)   Acute upper GI bleed  Discharge  Instructions: Discharge Instructions    Call MD for:  difficulty breathing, headache or visual disturbances   Complete by:  As directed    Call MD for:  extreme fatigue   Complete by:  As directed    Call MD for:  persistant dizziness or light-headedness   Complete by:  As directed    Call MD for:  persistant nausea and vomiting   Complete by:  As directed    Call MD for:  severe uncontrolled pain   Complete by:  As directed    Increase activity slowly   Complete by:  As directed      Allergies as of 08/02/2017      Reactions   Pseudoephedrine Palpitations   Pseudophed-chlophedianol-gg Palpitations      Medication List    TAKE these medications   folic acid 1 MG tablet Commonly known as:  FOLVITE Take 1 tablet (1 mg total) by mouth daily. Start taking on:  08/03/2017   gabapentin 600 MG tablet Commonly known as:  NEURONTIN Take 600 mg by mouth 3 (  three) times daily.   lisdexamfetamine 50 MG capsule Commonly known as:  VYVANSE Take 50 mg by mouth daily.   multivitamin with minerals Tabs tablet Take 1 tablet by mouth daily. Start taking on:  08/03/2017   pantoprazole 40 MG tablet Commonly known as:  PROTONIX Take 1 tablet (40 mg total) by mouth 2 (two) times daily.   potassium chloride SA 20 MEQ tablet Commonly known as:  K-DUR,KLOR-CON Take 1 tablet (20 mEq total) by mouth daily for 5 doses.   sucralfate 1 g tablet Commonly known as:  CARAFATE Take 1 tablet (1 g total) by mouth 4 (four) times daily -  with meals and at bedtime.   thiamine 100 MG tablet Take 1 tablet (100 mg total) by mouth daily. Start taking on:  08/03/2017      Follow-up Information    [facility] Go in 2 day(s).   Contact information: [address] [place] [address] [phone]        Gelene Mink, NP. Schedule an appointment as soon as possible for a visit in 8 week(s).   Specialty:  Gastroenterology Contact information: [address] [place]  [address] [phone]          Allergies  Allergen Reactions  . Pseudoephedrine Palpitations  . Pseudophed-Chlophedianol-Gg Palpitations   Allergies as of 08/02/2017      Reactions   Pseudoephedrine Palpitations   Pseudophed-chlophedianol-gg Palpitations      Medication List    TAKE these medications   folic acid 1 MG tablet Commonly known as:  FOLVITE Take 1 tablet (1 mg total) by mouth daily. Start taking on:  08/03/2017   gabapentin 600 MG tablet Commonly known as:  NEURONTIN Take 600 mg by mouth 3 (three) times daily.   lisdexamfetamine 50 MG capsule Commonly known as:  VYVANSE Take 50 mg by mouth daily.   multivitamin with minerals Tabs tablet Take 1 tablet by mouth daily. Start taking on:  08/03/2017   pantoprazole 40 MG tablet Commonly known as:  PROTONIX Take 1 tablet (40 mg total) by mouth 2 (two) times daily.   potassium chloride SA 20 MEQ tablet Commonly known as:  K-DUR,KLOR-CON Take 1 tablet (20 mEq total) by mouth daily for 5 doses.   sucralfate 1 g tablet Commonly known as:  CARAFATE Take 1 tablet (1 g total) by mouth 4 (four) times daily -  with meals and at bedtime.   thiamine 100 MG tablet Take 1 tablet (100 mg total) by mouth daily. Start taking on:  08/03/2017       Procedures/Studies: Dg Chest 2 View  Result Date: 07/29/2017 CLINICAL DATA:  Nausea and vomiting EXAM: CHEST - 2 VIEW COMPARISON:  12/18/2016 FINDINGS: The heart size and mediastinal contours are within normal limits. Both lungs are clear. The visualized skeletal structures are unremarkable. IMPRESSION: No active cardiopulmonary disease. Electronically Signed   By: Deatra Robinson M.D.   On: 07/29/2017 22:47   Ct Head Wo Contrast  Result Date: 07/29/2017 CLINICAL DATA:  Loss of consciousness following bowel movement with fall and maxillofacial trauma. Emesis. Initial encounter. EXAM: CT HEAD WITHOUT CONTRAST TECHNIQUE: Contiguous axial images were obtained from the base of the skull  through the vertex without intravenous contrast. COMPARISON:  12/18/2016 FINDINGS: Brain: There is no evidence of acute infarct, intracranial hemorrhage, mass, midline shift, or extra-axial fluid collection. The ventricles and sulci are normal. Vascular: No hyperdense vessel. Skull: No fracture or suspicious osseous lesion. Sinuses/Orbits: Visualized paranasal sinuses and mastoid  air cells are clear. Orbits are unremarkable. Other: None. IMPRESSION: Negative head CT. Electronically Signed   By: Sebastian Ache M.D.   On: 07/29/2017 17:48      Subjective: Pt denies withdrawal symptoms, says he feels much better and asking to go home.  He has been up and ambulating in last 24 hours.   Discharge Exam: Vitals:   08/01/17 2351 08/02/17 0512  BP: 114/70 136/81  Pulse: 81 80  Resp: 18 18  Temp: 98.7 F (37.1 C) 98.6 F (37 C)  SpO2: 97% 99%   Vitals:   07/31/17 2144 08/01/17 0442 08/01/17 2351 08/02/17 0512  BP: 123/79 129/77 114/70 136/81  Pulse: 79 75 81 80  Resp:   18 18  Temp: 98.8 F (37.1 C) 99.4 F (37.4 C) 98.7 F (37.1 C) 98.6 F (37 C)  TempSrc: Oral Oral Oral Oral  SpO2: 98% 100% 97% 99%  Weight:  77.3 kg (170 lb 6.7 oz)  75.6 kg (166 lb 11.2 oz)  Height:       General: Pt is alert, awake, not in acute distress.  Cardiovascular: RRR, S1/S2 +, no rubs, no gallops Respiratory: CTA bilaterally, no wheezing, no rhonchi Abdominal: Soft, NT, ND, bowel sounds + Extremities: no edema, no cyanosis Neurological: nonfocal exam.    The results of significant diagnostics from this hospitalization (including imaging, microbiology, ancillary and laboratory) are listed below for reference.     Microbiology: Recent Results (from the past 240 hour(s))  Culture, Urine     Status: None   Collection Time: 07/29/17  9:46 PM  Result Value Ref Range Status   Specimen Description   Final    URINE, CLEAN CATCH Performed at [facility], [address] [place], [place] [address]    Special  Requests   Final    NONE Performed at [facility], [address], [place], [place] [address]    Culture   Final    NO GROWTH Performed at [facility], [address] [place], [place] [address]    Report Status 07/31/2017 FINAL  Final     Labs: BNP (last 3 results) No results for input(s): BNP in the last 8760 hours. Basic Metabolic Panel: Recent Labs  Lab 07/29/17 1635 07/30/17 0457 07/31/17 0557 08/01/17 0511 08/01/17 0643 08/02/17 0611  NA 140 137 136 136  --  139  K 3.1* 3.2* 3.3* 3.0*  --  3.2*  CL 106 107 104 101  --  106  CO2 22 20* 24 26  --  26  GLUCOSE 147* 141* 135* 116*  --  125*  BUN 35* 33* 15 11  --  9  CREATININE 1.16 0.94 1.03 0.92  --  0.99  CALCIUM 8.7* 7.7* 7.6* 8.1*  --  7.9*  MG  --  1.5*  --   --  2.1  --    Liver Function Tests: Recent Labs  Lab 07/29/17 1635  AST 36  ALT 46  ALKPHOS 60  BILITOT 0.7  PROT 6.7  ALBUMIN 4.1   Recent Labs  Lab 07/29/17 1635  LIPASE 32   No results for input(s): AMMONIA in the last 168 hours. CBC: Recent Labs  Lab 07/29/17 1635  07/30/17 0457 07/30/17 1856 07/31/17 0557 08/01/17 0511 08/02/17 0611  WBC 21.8*   < > 15.5* 13.8* 12.0* 9.8 8.0  NEUTROABS 18.1*  --   --   --   --   --   --   HGB 13.9   < >  9.3* 7.7* 7.2* 8.7* 8.4*  HCT 40.8   < > 27.1* 22.0* 20.9* 25.4* 24.7*  MCV 88.7   < > 88.3 88.4 88.6 86.4 88.5  PLT 377   < > 305 272 278 244 307   < > = values in this interval not displayed.   Cardiac Enzymes: No results for input(s): CKTOTAL, CKMB, CKMBINDEX, TROPONINI in the last 168 hours. BNP: Invalid input(s): POCBNP CBG: No results for input(s): GLUCAP in the last 168 hours. D-Dimer No results for input(s): DDIMER in the last 72 hours. Hgb A1c No results for input(s): HGBA1C in the last 72 hours. Lipid Profile No results for input(s): CHOL, HDL, LDLCALC, TRIG, CHOLHDL, LDLDIRECT in the last 72 hours. Thyroid function studies No results for input(s): TSH, T4TOTAL, T3FREE,  THYROIDAB in the last 72 hours.  Invalid input(s): FREET3 Anemia work up No results for input(s): VITAMINB12, FOLATE, FERRITIN, TIBC, IRON, RETICCTPCT in the last 72 hours. Urinalysis    Component Value Date/Time   COLORURINE YELLOW 07/29/2017 1620   APPEARANCEUR CLEAR 07/29/2017 1620   LABSPEC 1.027 07/29/2017 1620   PHURINE 6.0 07/29/2017 1620   GLUCOSEU NEGATIVE 07/29/2017 1620   HGBUR NEGATIVE 07/29/2017 1620   BILIRUBINUR NEGATIVE 07/29/2017 1620   KETONESUR NEGATIVE 07/29/2017 1620   PROTEINUR NEGATIVE 07/29/2017 1620   UROBILINOGEN 0.2 11/03/2012 0700   NITRITE NEGATIVE 07/29/2017 1620   LEUKOCYTESUR NEGATIVE 07/29/2017 1620   Sepsis Labs Invalid input(s): PROCALCITONIN,  WBC,  LACTICIDVEN Microbiology Recent Results (from the past 240 hour(s))  Culture, Urine     Status: None   Collection Time: 07/29/17  9:46 PM  Result Value Ref Range Status   Specimen Description   Final    URINE, CLEAN CATCH Performed at [facility], [address], [place], [place] [address]    Special Requests   Final    NONE Performed at [facility], [address], [place], [place] [address]    Culture   Final    NO GROWTH Performed at [facility], [address] [place], [place] [address]    Report Status 07/31/2017 FINAL  Final   Time coordinating discharge: 35 MINUTES  SIGNED:  Standley Dakins, MD  [facility] 08/02/2017, 10:11 AM Pager [phone]  If 7PM-7AM, please contact night-coverage [email or web address] Password [email or web address]

## 2017-08-03 ENCOUNTER — Encounter: Payer: Self-pay | Admitting: Gastroenterology

## 2017-08-03 ENCOUNTER — Encounter (HOSPITAL_COMMUNITY): Payer: Self-pay | Admitting: Internal Medicine

## 2017-08-03 NOTE — Telephone Encounter (Signed)
PATIENT SCHEDULED AND LETTER SENT  °

## 2017-08-03 NOTE — ED Notes (Signed)
CRITICAL VALUE ALERT  Critical Value:  Lactic acid 2.91  Date & Time Notied:  07/29/17  1724  Provider Notified: dr Rhunette Croftnanavati  Orders Received/Actions taken:

## 2017-08-04 LAB — BLOOD GAS, VENOUS
Acid-base deficit: 2.5 mmol/L — ABNORMAL HIGH (ref 0.0–2.0)
Bicarbonate: 21.4 mmol/L (ref 20.0–28.0)
FIO2: 21
O2 Saturation: 51.1 %
PCO2 VEN: 37.4 mmHg — AB (ref 44.0–60.0)
PRESSURE CONTROL: 7.383 cmH2O

## 2017-08-09 ENCOUNTER — Encounter: Payer: Self-pay | Admitting: Internal Medicine

## 2017-10-21 ENCOUNTER — Ambulatory Visit: Payer: 59 | Admitting: Gastroenterology

## 2017-10-21 ENCOUNTER — Encounter: Payer: Self-pay | Admitting: Gastroenterology

## 2017-10-21 ENCOUNTER — Telehealth: Payer: Self-pay | Admitting: Gastroenterology

## 2017-10-21 NOTE — Telephone Encounter (Signed)
Patient was a no show and letter sent  °

## 2018-08-04 IMAGING — CT CT HEAD W/O CM
4 series · 17 of 47 positions shown, 19 images · non-contrast
Comparison: 12/18/2016

CLINICAL DATA: Loss of consciousness following bowel movement with
fall and maxillofacial trauma. Emesis. Initial encounter.

EXAM:
CT HEAD WITHOUT CONTRAST
TECHNIQUE: Contiguous axial images were obtained from the base of the skull
through the vertex without intravenous contrast.

[Series 2: head trauma wo · axial · 0.45mm/px · z∈[-11,+109]mm · 7 of 34 slices shown, 9 images]
[im 5/34  brain]
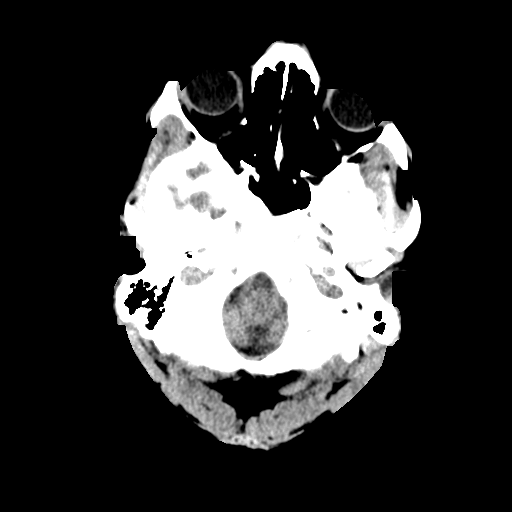
[im 5/34  bone]
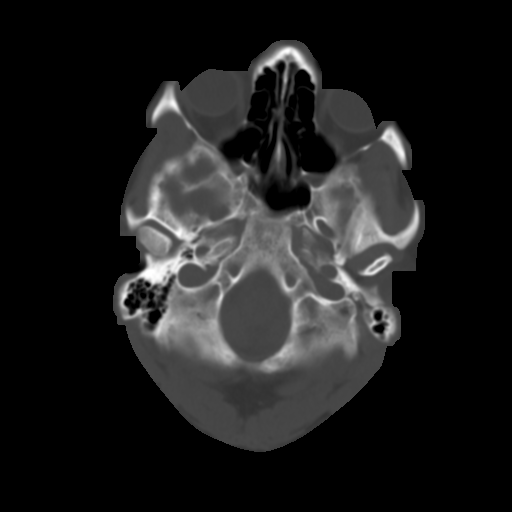
[im 9/34  brain]
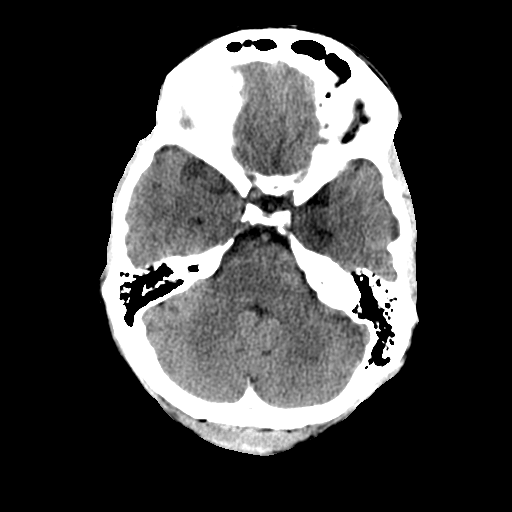
[im 13/34  brain]
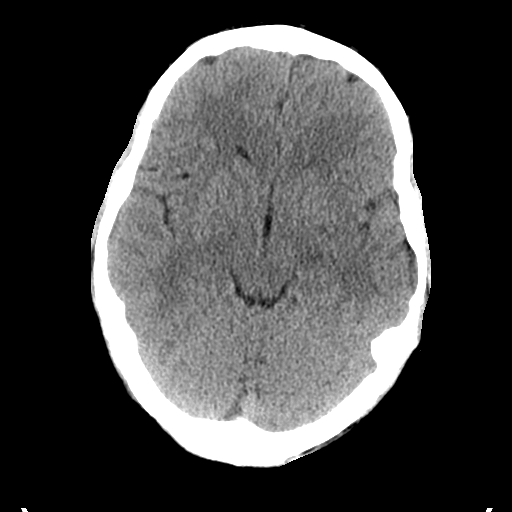
[im 17/34  brain]
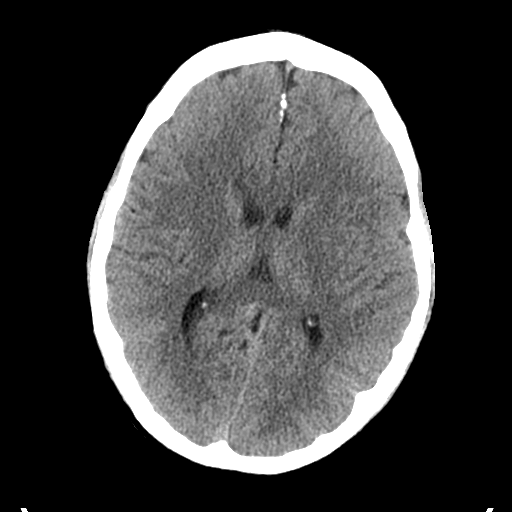
[im 21/34  brain]
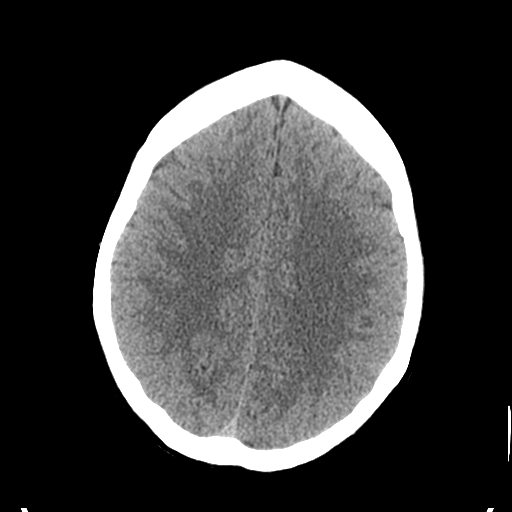
[im 21/34  bone]
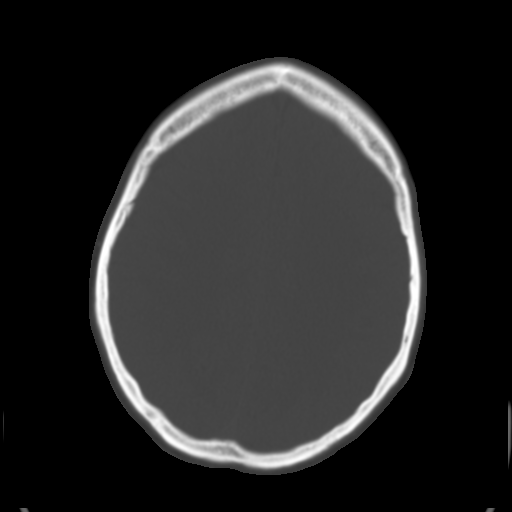
[im 25/34  brain]
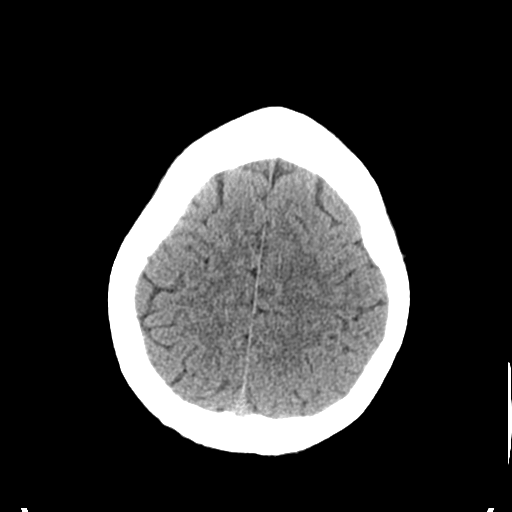
[im 29/34  brain]
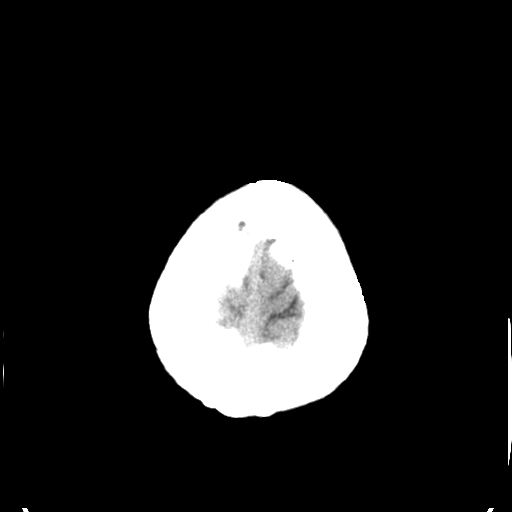

[Series 3: head bone · axial · 0.45mm/px · z∈[-15,+43]mm · 4 of 85 slices shown]
[im 9/85  bone]
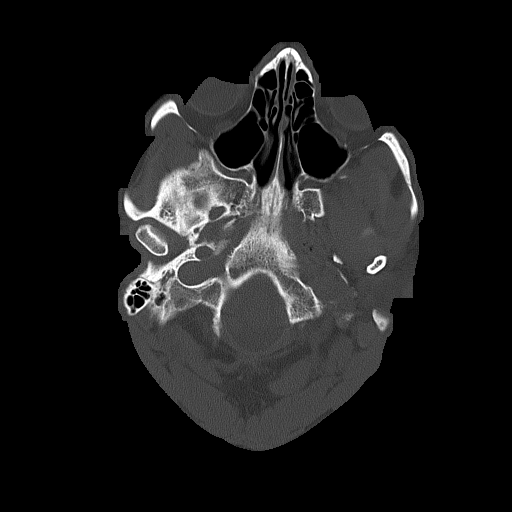
[im 17/85  bone]
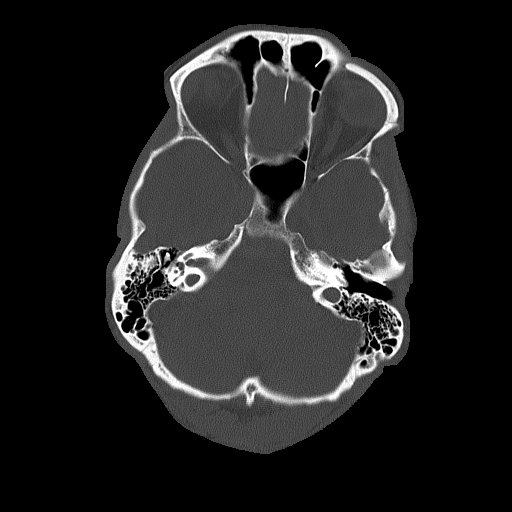
[im 26/85  bone]
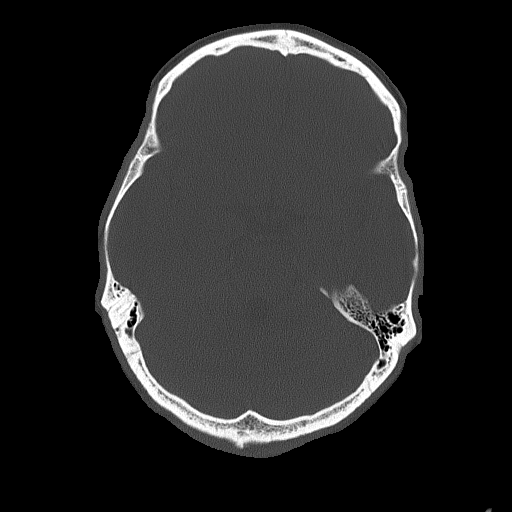
[im 38/85  bone]
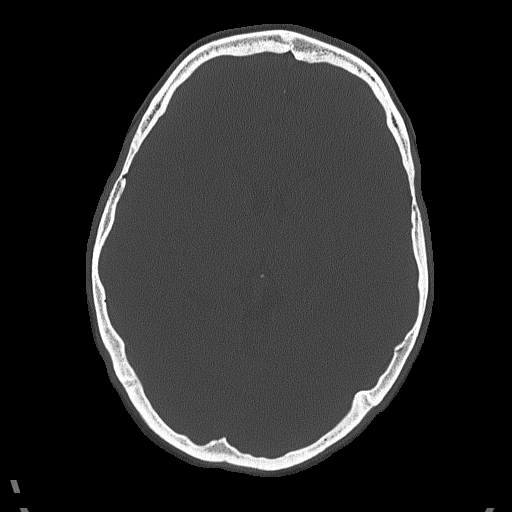

[Series 4: coronal soft tissue · coronal · 0.35mm/px · 3 of 72 slices shown]
[im 24/72  brain]
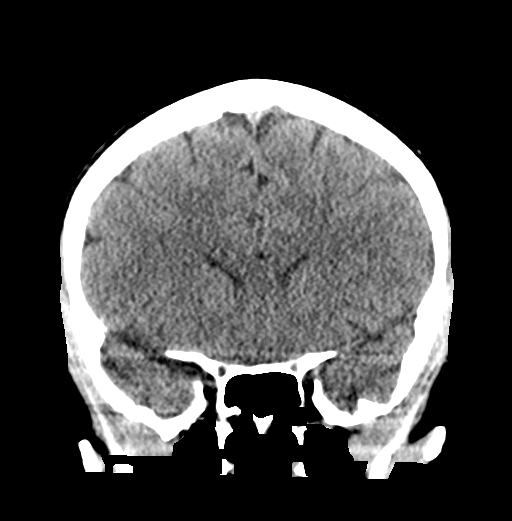
[im 32/72  brain]
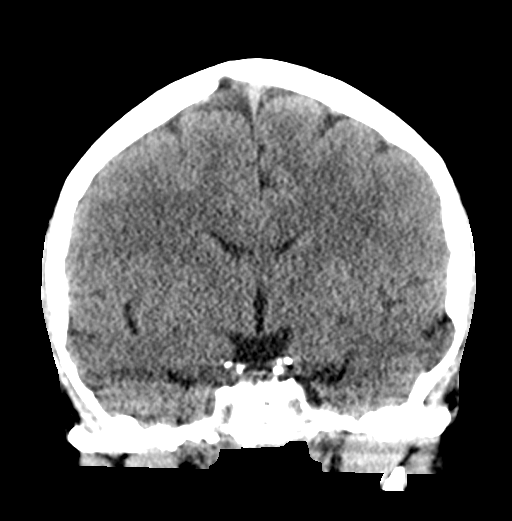
[im 40/72  brain]
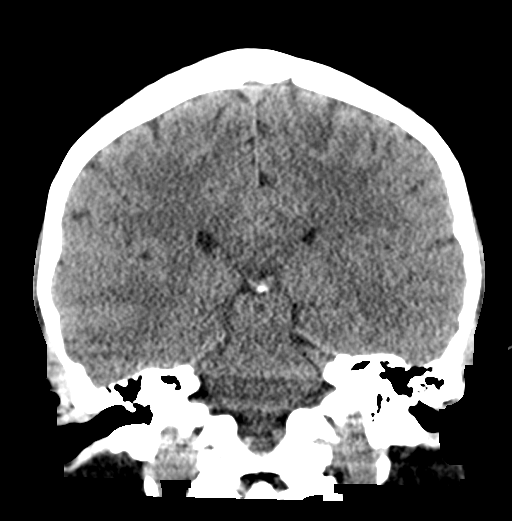

[Series 5: sagittal soft tissue · sagittal · 0.37mm/px · 3 of 58 slices shown]
[im 20/58  brain]
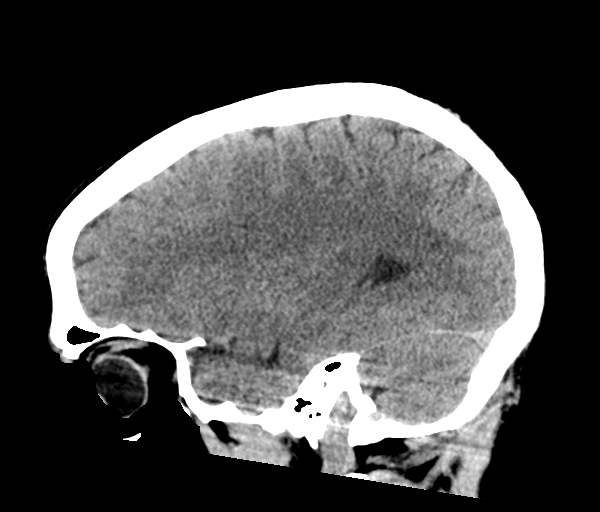
[im 29/58  brain]
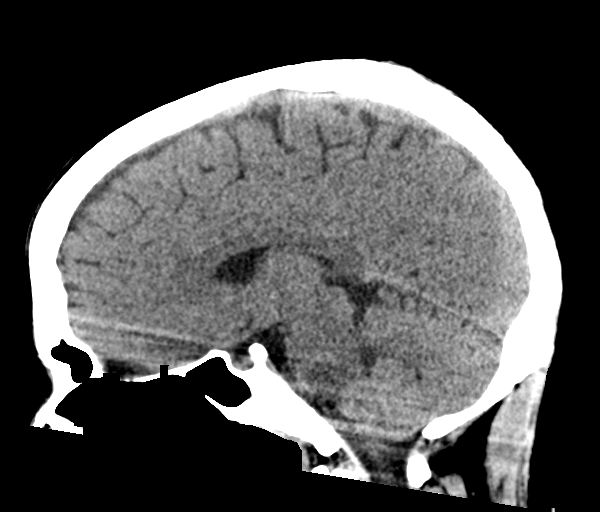
[im 39/58  brain]
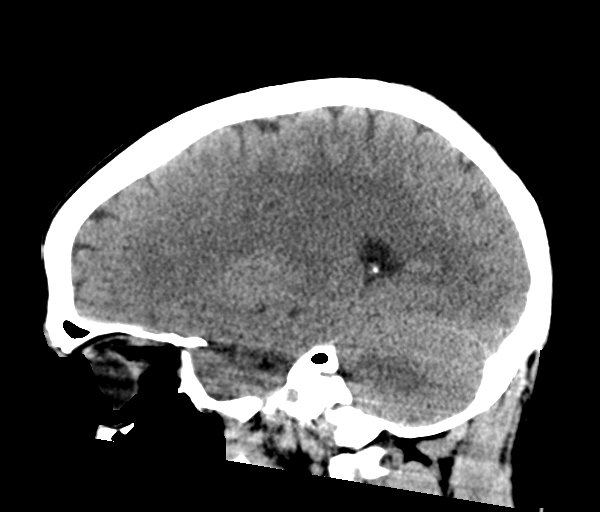

[17 of 47 positions shown; findings below may reference images not displayed]

FINDINGS: Brain: There is no evidence of acute infarct, intracranial
hemorrhage, mass, midline shift, or extra-axial fluid collection.
The ventricles and sulci are normal.

Vascular: No hyperdense vessel.

Skull: No fracture or suspicious osseous lesion.

Sinuses/Orbits: Visualized paranasal sinuses and mastoid air cells
are clear. Orbits are unremarkable.

Other: None.
IMPRESSION: Negative head CT.
